# Patient Record
Sex: Female | Born: 1950 | Race: White | Hispanic: No | Smoking: Former smoker
Health system: Southern US, Community
[De-identification: ages and names within clinical notes are randomized; demographics above are authoritative.]

## PROBLEM LIST (undated history)

## (undated) DIAGNOSIS — E785 Hyperlipidemia, unspecified: Secondary | ICD-10-CM

## (undated) DIAGNOSIS — M1990 Unspecified osteoarthritis, unspecified site: Secondary | ICD-10-CM

## (undated) DIAGNOSIS — G4733 Obstructive sleep apnea (adult) (pediatric): Secondary | ICD-10-CM

## (undated) DIAGNOSIS — Z9989 Dependence on other enabling machines and devices: Secondary | ICD-10-CM

## (undated) DIAGNOSIS — I10 Essential (primary) hypertension: Secondary | ICD-10-CM

## (undated) HISTORY — DX: Dependence on other enabling machines and devices: Z99.89

## (undated) HISTORY — DX: Essential (primary) hypertension: I10

## (undated) HISTORY — DX: Unspecified osteoarthritis, unspecified site: M19.90

## (undated) HISTORY — PX: COLONOSCOPY: SHX174

## (undated) HISTORY — DX: Obstructive sleep apnea (adult) (pediatric): G47.33

## (undated) HISTORY — DX: Hyperlipidemia, unspecified: E78.5

## (undated) HISTORY — PX: UPPER GI ENDOSCOPY: SHX6162

---

## 1998-09-07 HISTORY — PX: ABDOMINAL HYSTERECTOMY: SHX81

## 2005-02-09 ENCOUNTER — Ambulatory Visit: Payer: Self-pay

## 2006-05-23 ENCOUNTER — Other Ambulatory Visit: Payer: Self-pay

## 2006-05-23 ENCOUNTER — Inpatient Hospital Stay: Payer: Self-pay | Admitting: Internal Medicine

## 2006-05-24 ENCOUNTER — Other Ambulatory Visit: Payer: Self-pay

## 2014-03-11 ENCOUNTER — Emergency Department: Payer: Self-pay | Admitting: Emergency Medicine

## 2016-12-15 DIAGNOSIS — Z1231 Encounter for screening mammogram for malignant neoplasm of breast: Secondary | ICD-10-CM | POA: Diagnosis not present

## 2017-02-17 DIAGNOSIS — M62838 Other muscle spasm: Secondary | ICD-10-CM | POA: Diagnosis not present

## 2017-02-17 DIAGNOSIS — M549 Dorsalgia, unspecified: Secondary | ICD-10-CM | POA: Diagnosis not present

## 2017-05-04 DIAGNOSIS — K219 Gastro-esophageal reflux disease without esophagitis: Secondary | ICD-10-CM | POA: Diagnosis not present

## 2017-05-04 DIAGNOSIS — E8881 Metabolic syndrome: Secondary | ICD-10-CM | POA: Diagnosis not present

## 2017-05-04 DIAGNOSIS — I119 Hypertensive heart disease without heart failure: Secondary | ICD-10-CM | POA: Diagnosis not present

## 2017-05-04 DIAGNOSIS — Z13220 Encounter for screening for lipoid disorders: Secondary | ICD-10-CM | POA: Diagnosis not present

## 2017-05-04 DIAGNOSIS — R6889 Other general symptoms and signs: Secondary | ICD-10-CM | POA: Diagnosis not present

## 2017-05-04 DIAGNOSIS — R946 Abnormal results of thyroid function studies: Secondary | ICD-10-CM | POA: Diagnosis not present

## 2017-05-04 DIAGNOSIS — E782 Mixed hyperlipidemia: Secondary | ICD-10-CM | POA: Diagnosis not present

## 2017-05-19 DIAGNOSIS — M170 Bilateral primary osteoarthritis of knee: Secondary | ICD-10-CM | POA: Diagnosis not present

## 2017-06-07 ENCOUNTER — Ambulatory Visit (INDEPENDENT_AMBULATORY_CARE_PROVIDER_SITE_OTHER): Payer: Medicare Other | Admitting: General Surgery

## 2017-06-07 ENCOUNTER — Encounter: Payer: Self-pay | Admitting: General Surgery

## 2017-06-07 VITALS — BP 130/70 | HR 72 | Ht 59.0 in | Wt 170.0 lb

## 2017-06-07 DIAGNOSIS — Z800 Family history of malignant neoplasm of digestive organs: Secondary | ICD-10-CM | POA: Diagnosis not present

## 2017-06-07 DIAGNOSIS — Z1211 Encounter for screening for malignant neoplasm of colon: Secondary | ICD-10-CM | POA: Diagnosis not present

## 2017-06-07 MED ORDER — POLYETHYLENE GLYCOL 3350 17 GM/SCOOP PO POWD: ORAL | 0 refills | Status: DC

## 2017-06-07 NOTE — Progress Notes (Signed)
Patient ID: Christina Meadows, female   DOB: Aug 31, 1951, 66 y.o.   MRN: [id number]  Chief Complaint  Patient presents with  . Colonoscopy    HPI Christina Meadows is a 66 y.o. female here for a consult for screening colonoscopy. She does have a history of anal fissure. She reports no other GI issues.  The patient was accompanying her ex-husband for his postoperative visit and mentioned that she had return to the area and since her prior colonoscopy her brother had been diagnosed with colon cancer.  HPI  Past Medical History:  Diagnosis Date  . Arthritis   . Hyperlipidemia   . Hypertension     Past Surgical History:  Procedure Laterality Date  . ABDOMINAL HYSTERECTOMY  2000  . COLONOSCOPY    . UPPER GI ENDOSCOPY      Family History  Problem Relation Age of Onset  . Lung cancer Mother 1  . Diabetes Father   . Colon cancer Brother 33  . Throat cancer Maternal Grandmother     Social History Social History  Substance Use Topics  . Smoking status: Former Smoker    Types: Cigarettes    Quit date: 09/07/1978  . Smokeless tobacco: Never Used  . Alcohol use Yes    Allergies no known allergies  Current Outpatient Prescriptions  Medication Sig Dispense Refill  . acetaminophen (TYLENOL) 500 MG tablet Take 1,000 mg by mouth daily.    . citalopram (CELEXA) 20 MG tablet Take 20 mg by mouth daily.    Marland Kitchen lisinopril-hydrochlorothiazide (PRINZIDE,ZESTORETIC) 20-25 MG tablet Take 1 tablet by mouth daily.    . meloxicam (MOBIC) 15 MG tablet Take 15 mg by mouth daily.    . verapamil (CALAN) 120 MG tablet Take 120 mg by mouth 2 (two) times daily.    . polyethylene glycol powder (GLYCOLAX/MIRALAX) powder 255 grams one bottle for colonoscopy prep 255 g 0   No current facility-administered medications for this visit.     Review of Systems Review of Systems  Constitutional: Negative.   Respiratory: Negative.   Cardiovascular: Negative.     Blood pressure 130/70, pulse 72, height 4\' 11"   (1.499 m), weight 170 lb (77.1 kg).  Physical Exam Physical Exam  Constitutional: She is oriented to person, place, and time. She appears well-developed and well-nourished.  Eyes: Conjunctivae are normal. No scleral icterus.  Neck: Neck supple.  Cardiovascular: Normal rate, regular rhythm and normal heart sounds.   Pulmonary/Chest: Effort normal and breath sounds normal.  Lymphadenopathy:    She has no cervical adenopathy.  Neurological: She is alert and oriented to person, place, and time.  Skin: Skin is warm and dry.  Psychiatric: She has a normal mood and affect.    Data Reviewed Record of previous colonoscopy not available. Prior to 2004.  Assessment    New family history of colon cancer, candidate for screening exam.    Plan    Colonoscopy with possible biopsy/polypectomy prn: Information regarding the procedure, including its potential risks and complications (including but not limited to perforation of the bowel, which may require emergency surgery to repair, and bleeding) was verbally given to the patient. Educational information regarding lower intestinal endoscopy was given to the patient. Written instructions for how to complete the bowel prep using Miralax were provided. The importance of drinking ample fluids to avoid dehydration as a result of the prep emphasized.    HPI, Physical Exam, Assessment and Plan have been scribed under the direction and in the presence of  Christina Bellow, MD  Christina Living, LPN  I have completed the exam and reviewed the above documentation for accuracy and completeness.  I agree with the above.  Haematologist has been used and any errors in dictation or transcription are unintentional.  Christina Ard, M.D., F.A.C.S.  Christina Meadows 06/07/2017, 9:14 PM  Patient has been scheduled for a colonoscopy on 07-06-17 at [facility]. Miralax prescription has been sent in to the patient's pharmacy today. Colonoscopy instructions have  been reviewed with the patient. This patient is aware to call the office if they have further questions.   Christina Meadows, CMA

## 2017-06-07 NOTE — Patient Instructions (Signed)
Colonoscopy, Adult  A colonoscopy is an exam to look at the large intestine. It is done to check for problems, such as:  · Lumps (tumors).  · Growths (polyps).  · Swelling (inflammation).  · Bleeding.    What happens before the procedure?  Eating and drinking   Follow instructions from your doctor about eating and drinking. These instructions may include:  · A few days before the procedure - follow a low-fiber diet.  ? Avoid nuts.  ? Avoid seeds.  ? Avoid dried fruit.  ? Avoid raw fruits.  ? Avoid vegetables.  · 1-3 days before the procedure - follow a clear liquid diet. Avoid liquids that have red or purple dye. Drink only clear liquids, such as:  ? Clear broth or bouillon.  ? Black coffee or tea.  ? Clear juice.  ? Clear soft drinks or sports drinks.  ? Gelatin dessert.  ? Popsicles.  · On the day of the procedure - do not eat or drink anything during the 2 hours before the procedure.    Bowel prep   If you were prescribed an oral bowel prep:  · Take it as told by your doctor. Starting the day before your procedure, you will need to drink a lot of liquid. The liquid will cause you to poop (have bowel movements) until your poop is almost clear or light green.  · If your skin or butt gets irritated from diarrhea, you may:  ? Wipe the area with wipes that have medicine in them, such as adult wet wipes with aloe and vitamin E.  ? Put something on your skin that soothes the area, such as petroleum jelly.  · If you throw up (vomit) while drinking the bowel prep, take a break for up to 60 minutes. Then begin the bowel prep again. If you keep throwing up and you cannot take the bowel prep without throwing up, call your doctor.    General instructions   · Ask your doctor about changing or stopping your normal medicines. This is important if you take diabetes medicines or blood thinners.  · Plan to have someone take you home from the hospital or clinic.  What happens during the procedure?  · An IV tube may be put into one  of your veins.  · You will be given medicine to help you relax (sedative).  · To reduce your risk of infection:  ? Your doctors will wash their hands.  ? Your anal area will be washed with soap.  · You will be asked to lie on your side with your knees bent.  · Your doctor will get a long, thin, flexible tube ready. The tube will have a camera and a light on the end.  · The tube will be put into your anus.  · The tube will be gently put into your large intestine.  · Air will be delivered into your large intestine to keep it open. You may feel some pressure or cramping.  · The camera will be used to take photos.  · A small tissue sample may be removed from your body to be looked at under a microscope (biopsy). If any possible problems are found, the tissue will be sent to a lab for testing.  · If small growths are found, your doctor may remove them and have them checked for cancer.  · The tube that was put into your anus will be slowly removed.  The procedure may vary among doctors   and hospitals.  What happens after the procedure?  · Your doctor will check on you often until the medicines you were given have worn off.  · Do not drive for 24 hours after the procedure.  · You may have a small amount of blood in your poop.  · You may pass gas.  · You may have mild cramps or bloating in your belly (abdomen).  · It is up to you to get the results of your procedure. Ask your doctor, or the department performing the procedure, when your results will be ready.  This information is not intended to replace advice given to you by your health care provider. Make sure you discuss any questions you have with your health care provider.  Document Released: 09/26/2010 Document Revised: 06/24/2016 Document Reviewed: 11/05/2015  Elsevier Interactive Patient Education © 2017 [employer]vier Inc[employer]

## 2017-07-06 ENCOUNTER — Encounter
Admission: RE | Disposition: A | Discharge status: Home / Self Care | Payer: Self-pay | Source: Ambulatory Visit | Attending: General Surgery

## 2017-07-06 ENCOUNTER — Ambulatory Visit: Payer: Medicare Other | Admitting: Anesthesiology

## 2017-07-06 ENCOUNTER — Ambulatory Visit
Admission: RE | Admit: 2017-07-06 | Discharge: 2017-07-06 | Disposition: A | Discharge status: Home / Self Care | Payer: Medicare Other | Source: Ambulatory Visit | Attending: General Surgery | Admitting: General Surgery

## 2017-07-06 ENCOUNTER — Encounter: Payer: Self-pay | Admitting: *Deleted

## 2017-07-06 DIAGNOSIS — Z79899 Other long term (current) drug therapy: Secondary | ICD-10-CM | POA: Diagnosis not present

## 2017-07-06 DIAGNOSIS — Z90710 Acquired absence of both cervix and uterus: Secondary | ICD-10-CM | POA: Diagnosis not present

## 2017-07-06 DIAGNOSIS — M1990 Unspecified osteoarthritis, unspecified site: Secondary | ICD-10-CM | POA: Insufficient documentation

## 2017-07-06 DIAGNOSIS — Z6833 Body mass index (BMI) 33.0-33.9, adult: Secondary | ICD-10-CM | POA: Insufficient documentation

## 2017-07-06 DIAGNOSIS — I10 Essential (primary) hypertension: Secondary | ICD-10-CM | POA: Insufficient documentation

## 2017-07-06 DIAGNOSIS — E785 Hyperlipidemia, unspecified: Secondary | ICD-10-CM | POA: Insufficient documentation

## 2017-07-06 DIAGNOSIS — Z87891 Personal history of nicotine dependence: Secondary | ICD-10-CM | POA: Diagnosis not present

## 2017-07-06 DIAGNOSIS — Z800 Family history of malignant neoplasm of digestive organs: Secondary | ICD-10-CM | POA: Diagnosis not present

## 2017-07-06 DIAGNOSIS — Z1211 Encounter for screening for malignant neoplasm of colon: Secondary | ICD-10-CM | POA: Diagnosis not present

## 2017-07-06 HISTORY — PX: COLONOSCOPY WITH PROPOFOL: SHX5780

## 2017-07-06 SURGERY — COLONOSCOPY WITH PROPOFOL
Anesthesia: General

## 2017-07-06 MED ORDER — SODIUM CHLORIDE 0.9 % IV SOLN
INTRAVENOUS | Status: DC
  Administered 2017-07-06: 14:00:00 via INTRAVENOUS

## 2017-07-06 MED ORDER — PROPOFOL 500 MG/50ML IV EMUL
INTRAVENOUS | Status: DC | PRN
  Administered 2017-07-06: 150 ug/kg/min via INTRAVENOUS

## 2017-07-06 MED ORDER — PROPOFOL 10 MG/ML IV BOLUS
INTRAVENOUS | Status: DC | PRN
  Administered 2017-07-06: 50 mg via INTRAVENOUS

## 2017-07-06 MED ORDER — LIDOCAINE HCL (CARDIAC) 20 MG/ML IV SOLN
INTRAVENOUS | Status: DC | PRN
  Administered 2017-07-06: 60 mg via INTRAVENOUS

## 2017-07-06 NOTE — Transfer of Care (Signed)
Immediate Anesthesia Transfer of Care Note  Patient: Christina Meadows  Procedure(s) Performed: COLONOSCOPY WITH PROPOFOL (N/A )  Patient Location: Endoscopy Unit  Anesthesia Type:General  Level of Consciousness: drowsy and patient cooperative  Airway & Oxygen Therapy: Patient Spontanous Breathing and Patient connected to nasal cannula oxygen  Post-op Assessment: Report given to RN and Post -op Vital signs reviewed and stable  Post vital signs: Reviewed and stable  Last Vitals:  Vitals:   07/06/17 1403  BP: (!) 143/59  Pulse: (!) 58  Resp: 18  Temp: 37.5 C  SpO2: 100%    Last Pain:  Vitals:   07/06/17 1403  TempSrc: Tympanic         Complications: No apparent anesthesia complications

## 2017-07-06 NOTE — H&P (Signed)
No interval change in history or physical exam. Tolerated prep well. For screening colonoscopy.

## 2017-07-06 NOTE — Op Note (Signed)
[facility] Patient Name: Christina Meadows Procedure Date: 07/06/2017 2:27 PM MRN: [id number] Account #: [id number] Date of Birth: [birth year] Admit Type: Outpatient Age: 66 Room: [facility] ENDO ROOM 1 Gender: Female Note Status: Finalized Procedure:            Colonoscopy Indications:          Screening for colorectal malignant neoplasm Providers:            Robert Bellow, MD Referring MD:         Mikeal Hawthorne. Brynda Greathouse MD, MD (Referring MD) Medicines:            Monitored Anesthesia Care Complications:        No immediate complications. Procedure:            Pre-Anesthesia Assessment:                       - Prior to the procedure, a History and Physical was                        performed, and patient medications, allergies and                        sensitivities were reviewed. The patient's tolerance of                        previous anesthesia was reviewed.                       - The risks and benefits of the procedure and the                        sedation options and risks were discussed with the                        patient. All questions were answered and informed                        consent was obtained.                       After obtaining informed consent, the colonoscope was                        passed under direct vision. Throughout the procedure,                        the patient's blood pressure, pulse, and oxygen                        saturations were monitored continuously. The                        Colonoscope was introduced through the anus and                        advanced to the the cecum, identified by appendiceal                        orifice and ileocecal valve. The colonoscopy was  performed without difficulty. The patient tolerated the                        procedure well. The quality of the bowel preparation                        was excellent. Findings:      The entire examined colon  appeared normal on direct and retroflexion       views. Impression:           - The entire examined colon is normal on direct and                        retroflexion views.                       - No specimens collected. Recommendation:       - Repeat colonoscopy in 10 years for screening purposes. Procedure Code(s):    --- Professional ---                       [phone], Colonoscopy, flexible; diagnostic, including                        collection of specimen(s) by brushing or washing, when                        performed (separate procedure) Diagnosis Code(s):    --- Professional ---                       Z12.11, Encounter for screening for malignant neoplasm                        of colon CPT copyright 2016 American Medical Association. All rights reserved. The codes documented in this report are preliminary and upon coder review may  be revised to meet current compliance requirements. Robert Bellow, MD 07/06/2017 2:46:19 PM This report has been signed electronically. Number of Addenda: 0 Note Initiated On: 07/06/2017 2:27 PM Scope Withdrawal Time: 0 hours 6 minutes 1 second  Total Procedure Duration: 0 hours 10 minutes 20 seconds       [facility]

## 2017-07-06 NOTE — Anesthesia Post-op Follow-up Note (Signed)
Anesthesia QCDR form completed.        

## 2017-07-06 NOTE — Anesthesia Preprocedure Evaluation (Signed)
Anesthesia Evaluation  Patient identified by MRN, date of birth, ID band Patient awake    Reviewed: Allergy & Precautions, NPO status , Patient's Chart, lab work & pertinent test results  Airway Mallampati: II       Dental  (+) Teeth Intact   Pulmonary neg pulmonary ROS, former smoker,    breath sounds clear to auscultation       Cardiovascular Exercise Tolerance: Good hypertension, Pt. on medications  Rhythm:Regular     Neuro/Psych    GI/Hepatic negative GI ROS, Neg liver ROS,   Endo/Other  Morbid obesity  Renal/GU negative Renal ROS  negative genitourinary   Musculoskeletal   Abdominal (+) + obese,   Peds negative pediatric ROS (+)  Hematology negative hematology ROS (+)   Anesthesia Other Findings   Reproductive/Obstetrics                             Anesthesia Physical Anesthesia Plan  ASA: II  Anesthesia Plan: General   Post-op Pain Management:    Induction:   PONV Risk Score and Plan: 0  Airway Management Planned: Natural Airway and Nasal Cannula  Additional Equipment:   Intra-op Plan:   Post-operative Plan:   Informed Consent: I have reviewed the patients History and Physical, chart, labs and discussed the procedure including the risks, benefits and alternatives for the proposed anesthesia with the patient or authorized representative who has indicated his/her understanding and acceptance.     Plan Discussed with: CRNA  Anesthesia Plan Comments:         Anesthesia Quick Evaluation

## 2017-07-07 ENCOUNTER — Encounter: Payer: Self-pay | Admitting: General Surgery

## 2017-07-07 NOTE — Anesthesia Postprocedure Evaluation (Signed)
Anesthesia Post Note  Patient: Christina Meadows  Procedure(s) Performed: COLONOSCOPY WITH PROPOFOL (N/A )  Patient location during evaluation: PACU Anesthesia Type: General Level of consciousness: awake Pain management: pain level controlled Vital Signs Assessment: post-procedure vital signs reviewed and stable Cardiovascular status: stable Anesthetic complications: no     Last Vitals:  Vitals:   07/06/17 1510 07/06/17 1520  BP: (!) 143/62 (!) 148/56  Pulse: 67 74  Resp: 17   Temp:    SpO2: 100%     Last Pain:  Vitals:   07/06/17 1450  TempSrc: Tympanic                 VAN STAVEREN,Keijuan Schellhase

## 2017-07-16 DIAGNOSIS — M25561 Pain in right knee: Secondary | ICD-10-CM | POA: Diagnosis not present

## 2017-07-16 DIAGNOSIS — M1712 Unilateral primary osteoarthritis, left knee: Secondary | ICD-10-CM | POA: Diagnosis not present

## 2017-07-16 DIAGNOSIS — M25562 Pain in left knee: Secondary | ICD-10-CM | POA: Diagnosis not present

## 2017-07-16 DIAGNOSIS — M170 Bilateral primary osteoarthritis of knee: Secondary | ICD-10-CM | POA: Diagnosis not present

## 2017-08-20 DIAGNOSIS — M25562 Pain in left knee: Secondary | ICD-10-CM | POA: Diagnosis not present

## 2017-09-03 DIAGNOSIS — R001 Bradycardia, unspecified: Secondary | ICD-10-CM | POA: Diagnosis not present

## 2017-09-05 DIAGNOSIS — R001 Bradycardia, unspecified: Secondary | ICD-10-CM | POA: Diagnosis not present

## 2017-09-07 DIAGNOSIS — G4733 Obstructive sleep apnea (adult) (pediatric): Secondary | ICD-10-CM

## 2017-09-07 HISTORY — DX: Obstructive sleep apnea (adult) (pediatric): G47.33

## 2017-09-14 DIAGNOSIS — J039 Acute tonsillitis, unspecified: Secondary | ICD-10-CM | POA: Diagnosis not present

## 2017-10-06 DIAGNOSIS — M170 Bilateral primary osteoarthritis of knee: Secondary | ICD-10-CM | POA: Diagnosis not present

## 2017-10-06 DIAGNOSIS — M1711 Unilateral primary osteoarthritis, right knee: Secondary | ICD-10-CM | POA: Diagnosis not present

## 2017-10-13 DIAGNOSIS — I10 Essential (primary) hypertension: Secondary | ICD-10-CM | POA: Diagnosis not present

## 2017-10-19 DIAGNOSIS — Z7689 Persons encountering health services in other specified circumstances: Secondary | ICD-10-CM | POA: Diagnosis not present

## 2017-10-19 DIAGNOSIS — R002 Palpitations: Secondary | ICD-10-CM | POA: Diagnosis not present

## 2017-10-19 DIAGNOSIS — G4730 Sleep apnea, unspecified: Secondary | ICD-10-CM | POA: Diagnosis not present

## 2017-10-19 DIAGNOSIS — R0602 Shortness of breath: Secondary | ICD-10-CM | POA: Diagnosis not present

## 2017-10-19 DIAGNOSIS — R0683 Snoring: Secondary | ICD-10-CM | POA: Diagnosis not present

## 2017-10-19 DIAGNOSIS — I10 Essential (primary) hypertension: Secondary | ICD-10-CM | POA: Diagnosis not present

## 2017-10-19 DIAGNOSIS — R9431 Abnormal electrocardiogram [ECG] [EKG]: Secondary | ICD-10-CM | POA: Diagnosis not present

## 2017-10-25 DIAGNOSIS — R9431 Abnormal electrocardiogram [ECG] [EKG]: Secondary | ICD-10-CM | POA: Diagnosis not present

## 2017-11-01 DIAGNOSIS — J320 Chronic maxillary sinusitis: Secondary | ICD-10-CM | POA: Diagnosis not present

## 2017-11-01 DIAGNOSIS — J301 Allergic rhinitis due to pollen: Secondary | ICD-10-CM | POA: Diagnosis not present

## 2017-11-01 DIAGNOSIS — R42 Dizziness and giddiness: Secondary | ICD-10-CM | POA: Diagnosis not present

## 2017-11-01 DIAGNOSIS — H903 Sensorineural hearing loss, bilateral: Secondary | ICD-10-CM | POA: Diagnosis not present

## 2017-11-05 HISTORY — PX: KNEE SURGERY: SHX244

## 2017-11-10 ENCOUNTER — Ambulatory Visit (INDEPENDENT_AMBULATORY_CARE_PROVIDER_SITE_OTHER): Payer: Medicare Other | Admitting: Family Medicine

## 2017-11-10 ENCOUNTER — Encounter: Payer: Self-pay | Admitting: Family Medicine

## 2017-11-10 VITALS — BP 130/72 | HR 67 | Temp 98.2°F | Ht 59.25 in | Wt 176.2 lb

## 2017-11-10 DIAGNOSIS — Z23 Encounter for immunization: Secondary | ICD-10-CM

## 2017-11-10 DIAGNOSIS — Z7689 Persons encountering health services in other specified circumstances: Secondary | ICD-10-CM | POA: Diagnosis not present

## 2017-11-10 DIAGNOSIS — I10 Essential (primary) hypertension: Secondary | ICD-10-CM | POA: Diagnosis not present

## 2017-11-10 DIAGNOSIS — F339 Major depressive disorder, recurrent, unspecified: Secondary | ICD-10-CM | POA: Diagnosis not present

## 2017-11-10 DIAGNOSIS — R002 Palpitations: Secondary | ICD-10-CM | POA: Insufficient documentation

## 2017-11-10 MED ORDER — LISINOPRIL-HYDROCHLOROTHIAZIDE 20-25 MG PO TABS: 1.0000 | ORAL_TABLET | Freq: Every day | ORAL | 1 refills | Status: DC

## 2017-11-10 MED ORDER — CITALOPRAM HYDROBROMIDE 20 MG PO TABS: 20.0000 mg | ORAL_TABLET | Freq: Every day | ORAL | 1 refills | Status: DC

## 2017-11-10 NOTE — Patient Instructions (Addendum)
Please call and schedule an appointment for screening mammogram. A referral is not needed.  [place]   Please schedule an eye exam  Can try plain mucinex to loosen phlegm.   Please schedule your welcome to medicare visit for later this month.

## 2017-11-10 NOTE — Progress Notes (Signed)
Subjective:    Patient ID: Christina Meadows, female    DOB: 30-Apr-1951, 66 y.o.   MRN: [id number]  HPI This is a 67 yo female who presents today to establish care. Was previously seeing Dr. Brynda Greathouse who retired then saw the MD who took over his practice. Has had difficulty with access and communication so desires to establish here. She is divorced, lives with her daughter and her ex-husband. Has 4 grandchildren. She had a son who died at age 35. Enjoys yard work and is active with her church.   She had acute tonsillitis two months ago. Had two rounds of antibiotics. Was scheduled to have a knee replacement but had to have it rescheduled. She went to ENT. Had scan at ENT, was told she had severe sinus infection and was given additional antibiotic and prednisone and sinulair. Feels better. No cough, but feels like she has some sputum that won't come up. No nasal drainage. No fever.   Has osteoarthritis in both knees, is awaiting ENT clearance to have surgery, doing one at a time. Has been very limited in ability to walk due to pain.   Hypertension- currently off verapamil, couldn't get refill. Taking lisinopril/hctz. No side effects.   Depression after finding her brother dead of an MI and after her divorce. Husband left her for another woman who later died and now her ex husband lives with her and her daughter.   Went on [facility] 4/18.   Last CPE- she thinks it is the last five years Mammo- 2/18 Pap- hysterectomy Colonoscopy- 07/06/2017 Tdap- unknown Flu- declines Eye- over due Dental- dentures Exercise- not regular, somewhat limited by knee pain  Past Medical History:  Diagnosis Date  . Arthritis   . Hyperlipidemia   . Hypertension    Past Surgical History:  Procedure Laterality Date  . ABDOMINAL HYSTERECTOMY  2000  . COLONOSCOPY    . COLONOSCOPY WITH PROPOFOL N/A 07/06/2017   Procedure: COLONOSCOPY WITH PROPOFOL;  Surgeon: Robert Bellow, MD;  Location: [facility] ENDOSCOPY;   Service: Endoscopy;  Laterality: N/A;  . UPPER GI ENDOSCOPY     Family History  Problem Relation Age of Onset  . Lung cancer Mother 47  . Diabetes Father   . COPD Father   . Arthritis Father   . Colon cancer Brother 64  . Arthritis Brother   . Diabetes Brother   . Early death Brother   . Hearing loss Brother   . Heart disease Brother   . Hyperlipidemia Brother   . Hypertension Brother   . Throat cancer Maternal Grandmother    Social History   Tobacco Use  . Smoking status: Former Smoker    Types: Cigarettes    Last attempt to quit: 09/07/1978    Years since quitting: 39.2  . Smokeless tobacco: Never Used  Substance Use Topics  . Alcohol use: Yes  . Drug use: No      Review of Systems  Constitutional: Positive for fatigue. Negative for fever and unexpected weight change.  HENT: Positive for congestion and sinus pressure.   Eyes: Negative.   Respiratory: Positive for cough (dry).   Cardiovascular: Positive for palpitations (rare). Negative for chest pain and leg swelling.  Gastrointestinal: Positive for diarrhea (loose stools).  Genitourinary: Negative for dysuria and frequency.  Musculoskeletal: Positive for arthralgias (bilateral knees).  Neurological: Positive for dizziness (occasional with recent sinus problems) and headaches (occasional).       Objective:   Physical Exam  Constitutional: She is  oriented to person, place, and time. She appears well-developed and well-nourished. No distress.  Obese.   HENT:  Head: Normocephalic and atraumatic.  Right Ear: External ear normal.  Left Ear: External ear normal.  Eyes: Conjunctivae are normal.  Neck: Normal range of motion. Neck supple.  Cardiovascular: Normal rate.  Pulmonary/Chest: Effort normal. No respiratory distress.  Neurological: She is alert and oriented to person, place, and time.  Skin: Skin is warm and dry. She is not diaphoretic.  Psychiatric: She has a normal mood and affect. Her behavior is  normal. Judgment and thought content normal.  Vitals reviewed.     BP 130/72   Pulse 67   Temp 98.2 F (36.8 C) (Oral)   Ht 4' 11.25" (1.505 m)   Wt 176 lb 4 oz (79.9 kg)   SpO2 99%   BMI 35.30 kg/m  Wt Readings from Last 3 Encounters:  11/10/17 176 lb 4 oz (79.9 kg)  07/06/17 167 lb (75.8 kg)  06/07/17 170 lb (77.1 kg)   Depression screen [facility] 2/9 11/10/2017 11/10/2017  Decreased Interest 0 0  Down, Depressed, Hopeless 0 0  PHQ - 2 Score 0 0       Assessment & Plan:  1. Encounter to establish care - Discussed and encouraged healthy lifestyle choices- adequate sleep, regular exercise, stress management and healthy food choices.  - follow up with Welcome to Medicare Visit - Will request records from previous PCP and ENT - provided phone number to schedule mammogram - encouraged her to schedule eye exam - labs from last 6 months reviewed in Care Everywhere- no labs needed today  2. Need for vaccination with 13-polyvalent pneumococcal conjugate vaccine - Pneumococcal conjugate vaccine 13-valent  3. Essential hypertension - lisinopril-hydrochlorothiazide (PRINZIDE,ZESTORETIC) 20-25 MG tablet; Take 1 tablet by mouth daily.  Dispense: 90 tablet; Refill: 1 - blood pressure well controlled off verapamil, will continue on lisinopril-HCTZ  4. Depression, recurrent ([place]) - patient reports well controlled on citalopram - citalopram (CELEXA) 20 MG tablet; Take 1 tablet (20 mg total) by mouth daily.  Dispense: 90 tablet; Refill: [place], FNP-BC  [facility]  11/10/2017 8:22 PM

## 2017-11-15 DIAGNOSIS — J0100 Acute maxillary sinusitis, unspecified: Secondary | ICD-10-CM | POA: Diagnosis not present

## 2017-11-18 DIAGNOSIS — Z1231 Encounter for screening mammogram for malignant neoplasm of breast: Secondary | ICD-10-CM | POA: Diagnosis not present

## 2017-11-18 LAB — HM MAMMOGRAPHY

## 2017-11-19 DIAGNOSIS — M170 Bilateral primary osteoarthritis of knee: Secondary | ICD-10-CM | POA: Diagnosis not present

## 2017-11-19 DIAGNOSIS — F419 Anxiety disorder, unspecified: Secondary | ICD-10-CM | POA: Insufficient documentation

## 2017-11-19 DIAGNOSIS — I10 Essential (primary) hypertension: Secondary | ICD-10-CM | POA: Insufficient documentation

## 2017-11-19 DIAGNOSIS — R799 Abnormal finding of blood chemistry, unspecified: Secondary | ICD-10-CM | POA: Diagnosis not present

## 2017-12-01 ENCOUNTER — Ambulatory Visit (INDEPENDENT_AMBULATORY_CARE_PROVIDER_SITE_OTHER): Payer: Medicare Other | Admitting: Family Medicine

## 2017-12-01 ENCOUNTER — Encounter: Payer: Self-pay | Admitting: Family Medicine

## 2017-12-01 VITALS — BP 124/70 | HR 77 | Temp 98.1°F | Ht 59.0 in | Wt 174.5 lb

## 2017-12-01 DIAGNOSIS — Z0000 Encounter for general adult medical examination without abnormal findings: Secondary | ICD-10-CM

## 2017-12-01 DIAGNOSIS — M170 Bilateral primary osteoarthritis of knee: Secondary | ICD-10-CM

## 2017-12-01 DIAGNOSIS — I10 Essential (primary) hypertension: Secondary | ICD-10-CM

## 2017-12-01 NOTE — Patient Instructions (Signed)
  Christina Meadows , Thank you for taking time to come for your Medicare Wellness Visit. I appreciate your ongoing commitment to your health goals. Please review the following plan we discussed and let me know if I can assist you in the future.   These are the goals we discussed: Goals    Recover from knee replacement      This is a list of the screening recommended for you and due dates:  Health Maintenance  Topic Date Due  .  Hepatitis C: One time screening is recommended by Center for Disease Control  (CDC) for  adults born from [birth year] through 1964.   [birth year]  . HIV Screening  12/08/1966  . Tetanus Vaccine  12/08/1970  . Pap Smear  12/07/1972  . Mammogram  12/07/2001  . DEXA scan (bone density measurement)  12/07/2016  . Flu Shot  12/06/2018*  . Pneumonia vaccines (2 of 2 - PPSV23) 11/11/2018  . Colon Cancer Screening  07/07/2027  *Topic was postponed. The date shown is not the original due date.   Follow in 6 months

## 2017-12-01 NOTE — Progress Notes (Signed)
Subjective:    Christina Meadows is a 67 y.o. female who presents for Medicare Initial preventive examination.  Preventive Screening-Counseling & Management  Tobacco Social History   Tobacco Use  Smoking Status Former Smoker  . Types: Cigarettes  . Last attempt to quit: 09/07/1978  . Years since quitting: 39.2  Smokeless Tobacco Never Used     Problems Prior to Visit 1.   Current Problems (verified) Patient Active Problem List   Diagnosis Date Noted  . Palpitations 11/10/2017  . Sleep concern 11/10/2017  . Bilateral primary osteoarthritis of knee 07/16/2017  . Encounter for screening colonoscopy 06/07/2017  . Family history of colon cancer 06/07/2017    Medications Prior to Visit Current Outpatient Medications on File Prior to Visit  Medication Sig Dispense Refill  . acetaminophen (TYLENOL) 500 MG tablet Take 1,000 mg by mouth daily.    . citalopram (CELEXA) 20 MG tablet Take 1 tablet (20 mg total) by mouth daily. 90 tablet 1  . fluticasone (FLONASE) 50 MCG/ACT nasal spray     . lisinopril-hydrochlorothiazide (PRINZIDE,ZESTORETIC) 20-25 MG tablet Take 1 tablet by mouth daily. 90 tablet 1  . meloxicam (MOBIC) 15 MG tablet Take 15 mg by mouth daily.    . montelukast (SINGULAIR) 10 MG tablet      No current facility-administered medications on file prior to visit.     Current Medications (verified) Current Outpatient Medications  Medication Sig Dispense Refill  . acetaminophen (TYLENOL) 500 MG tablet Take 1,000 mg by mouth daily.    . citalopram (CELEXA) 20 MG tablet Take 1 tablet (20 mg total) by mouth daily. 90 tablet 1  . fluticasone (FLONASE) 50 MCG/ACT nasal spray     . lisinopril-hydrochlorothiazide (PRINZIDE,ZESTORETIC) 20-25 MG tablet Take 1 tablet by mouth daily. 90 tablet 1  . meloxicam (MOBIC) 15 MG tablet Take 15 mg by mouth daily.    . montelukast (SINGULAIR) 10 MG tablet      No current facility-administered medications for this visit.      Allergies  (verified) Patient has no known allergies.   PAST HISTORY  Family History Family History  Problem Relation Age of Onset  . Lung cancer Mother 15  . Diabetes Father   . COPD Father   . Arthritis Father   . Colon cancer Brother 33  . Arthritis Brother   . Diabetes Brother   . Early death Brother   . Hearing loss Brother   . Heart disease Brother   . Hyperlipidemia Brother   . Hypertension Brother   . Throat cancer Maternal Grandmother     Social History Social History   Tobacco Use  . Smoking status: Former Smoker    Types: Cigarettes    Last attempt to quit: 09/07/1978    Years since quitting: 39.2  . Smokeless tobacco: Never Used  Substance Use Topics  . Alcohol use: Yes     Are there smokers in your home (other than you)? No  Risk Factors Current exercise habits: Exercise is limited by orthopedic condition(s): Bilateral knee pain..  Dietary issues discussed: increased vegetables    Cardiac risk factors: advanced age (older than 60 for men, 45 for women).  Depression Screen (Note: if answer to either of the following is "Yes", a more complete depression screening is indicated)   Over the past 2 weeks, have you felt down, depressed or hopeless? No  Over the past 2 weeks, have you felt little interest or pleasure in doing things? No  Have you lost  interest or pleasure in daily life? No  Do you often feel hopeless? No  Do you cry easily over simple problems? No  Activities of Daily Living In your present state of health, do you have any difficulty performing the following activities?:  Driving? No Managing money?  No Feeding yourself? No Getting from bed to chair? No   BP 124/70   Pulse 77   Temp 98.1 F (36.7 C) (Oral)   Ht 4\' 11"  (1.499 m)   Wt 174 lb 8 oz (79.2 kg)   SpO2 94%   BMI 35.24 kg/m   General Appearance:    Alert, cooperative, no distress, appears stated age  Head:    Normocephalic, without obvious abnormality, atraumatic  Eyes:    PERRL,  conjunctiva/corneas clear, EOM's intact, fundi    benign, both eyes  Ears:    Normal TM's and external ear canals, both ears  Nose:   Nares normal, septum midline, mucosa normal, no drainage    or sinus tenderness  Throat:   Lips, mucosa, and tongue normal; teeth and gums normal  Neck:   Supple, symmetrical, trachea midline, no adenopathy;    thyroid:  no enlargement/tenderness/nodules; no carotid   bruit or JVD  Back:     Symmetric, no curvature, ROM normal, no CVA tenderness  Lungs:     Clear to auscultation bilaterally, respirations unlabored  Chest Wall:    No tenderness or deformity   Heart:    Regular rate and rhythm, S1 and S2 normal, no murmur, rub   or gallop  Breast Exam:    No tenderness, masses, or nipple abnormality  Abdomen:     Soft, non-tender, bowel sounds active all four quadrants,    no masses, no organomegaly  Genitalia:    Normal female without lesion, discharge or tenderness  Rectal:    Normal tone, normal prostate, no masses or tenderness;   guaiac negative stool  Extremities:   Extremities normal, atraumatic, no cyanosis or edema  Pulses:   2+ and symmetric all extremities  Skin:   Skin color, texture, turgor normal, no rashes or lesions  Lymph nodes:   Cervical, supraclavicular, and axillary nodes normal  Neurologic:   CNII-XII intact, normal strength, sensation and reflexes    throughout   Climbing a flight of stairs? Yes Preparing food and eating?: No Bathing or showering? No Getting dressed: No Getting to the toilet? No Using the toilet:No Moving around from place to place: No In the past year have you fallen or had a near fall?:Yes   Are you sexually active?  No  Do you have more than one partner?  No  Hearing Difficulties: No Do you often ask people to speak up or repeat themselves? No Do you experience ringing or noises in your ears? No Do you have difficulty understanding soft or whispered voices? No   Do you feel that you have a problem with  memory? No  Do you often misplace items? No  Do you feel safe at home?  Yes  Cognitive Testing  Alert? Yes  Normal Appearance?Yes  Oriented to person? Yes  Place? Yes   Time? Yes  Recall of three objects?  Yes  Can perform simple calculations? Yes  Displays appropriate judgment?Yes    Can read the correct time from a watch face?Yes   Advanced Directives have been discussed with the patient? Yes  List the Names of Other Physician/Practitioners you currently use: 1.  [facility]  Indicate any recent Medical  Services you may have received from other than [facility] providers in the past year (date may be approximate).  Immunization History  Administered Date(s) Administered  . Pneumococcal Conjugate-13 11/10/2017    Screening Tests Health Maintenance  Topic Date Due  . Hepatitis C Screening  13-Aug-1951  . HIV Screening  12/08/1966  . TETANUS/TDAP  12/08/1970  . PAP SMEAR  12/07/1972  . MAMMOGRAM  12/07/2001  . DEXA SCAN  12/07/2016  . INFLUENZA VACCINE  12/06/2018 (Originally 04/07/2017)  . PNA vac Low Risk Adult (2 of 2 - PPSV23) 11/11/2018  . COLONOSCOPY  07/07/2027    All answers were reviewed with the patient and necessary referrals were made:  Elby Beck, FNP   12/01/2017   History reviewed: allergies, current medications, past family history, past medical history, past social history, past surgical history and problem list  Review of Systems A comprehensive review of systems was negative.    Objective:     Vision by Snellen chart: right eye:20/20, left eye:20/20  Body mass index is 35.24 kg/m. BP 124/70   Pulse 77   Temp 98.1 F (36.7 C) (Oral)   Ht 4\' 11"  (1.499 m)   Wt 174 lb 8 oz (79.2 kg)   SpO2 94%   BMI 35.24 kg/m   BP 124/70   Pulse 77   Temp 98.1 F (36.7 C) (Oral)   Ht 4\' 11"  (1.499 m)   Wt 174 lb 8 oz (79.2 kg)   SpO2 94%   BMI 35.24 kg/m   General Appearance:    Alert, cooperative, no distress, appears stated age  Head:     Normocephalic, without obvious abnormality, atraumatic  Eyes:    PERRL, conjunctiva/corneas clear, EOM's intact  Ears:    External normal  Nose:   Nares normal, septum midline, mucosa normal, no drainage    or sinus tenderness  Throat:   Lips, mucosa, and tongue normal; teeth and gums normal  Neck:   Supple, symmetrical, trachea midline, no adenopathy;    thyroid:  no enlargement/tenderness/nodules  Back:     Symmetric, no curvature, ROM normal  Lungs:     Clear to auscultation bilaterally, respirations unlabored  Chest Wall:    No tenderness or deformity   Heart:    Regular rate and rhythm, S1 and S2 normal, no murmur, rub   or gallop  Breast Exam:    No tenderness, masses, or nipple abnormality  Abdomen:     Soft, non-tender, bowel sounds active all four quadrants,    no masses, no organomegaly  Genitalia:    Not performed  Rectal:    Not performed  Extremities:   Extremities normal, atraumatic, no cyanosis or edema  Pulses:   2+ and symmetric all extremities  Skin:   Skin color, texture, turgor normal, no rashes or lesions  Lymph nodes:   Cervical, supraclavicular, and axillary nodes normal  Neurologic:   Normal strength, sensation throughout       Assessment:     Welcome to Medicare preventive visit  Essential hypertension  Bilateral primary osteoarthritis of knee       Plan:     During the course of the visit the patient was educated and counseled about appropriate screening and preventive services including:    Screening mammography  Bone densitometry screening  Glaucoma screening   Awaiting records from prior PCP  Diet review for nutrition referral? Yes ____  Not Indicated ___X_   Patient Instructions (the written plan) was given to  the patient.  Medicare Attestation I have personally reviewed: The patient's medical and social history Their use of alcohol, tobacco or illicit drugs Their current medications and supplements The patient's functional  ability including ADLs,fall risks, home safety risks, cognitive, and hearing and visual impairment Diet and physical activities Evidence for depression or mood disorders  The patient's weight, height, BMI, and visual acuity have been recorded in the chart.  I have made referrals, counseling, and provided education to the patient based on review of the above and I have provided the patient with a written personalized care plan for preventive services.     Elby Beck, FNP   12/01/2017

## 2017-12-02 DIAGNOSIS — E669 Obesity, unspecified: Secondary | ICD-10-CM | POA: Diagnosis present

## 2017-12-02 DIAGNOSIS — M25561 Pain in right knee: Secondary | ICD-10-CM | POA: Diagnosis not present

## 2017-12-02 DIAGNOSIS — I959 Hypotension, unspecified: Secondary | ICD-10-CM | POA: Diagnosis not present

## 2017-12-02 DIAGNOSIS — Z90710 Acquired absence of both cervix and uterus: Secondary | ICD-10-CM | POA: Diagnosis not present

## 2017-12-02 DIAGNOSIS — Z471 Aftercare following joint replacement surgery: Secondary | ICD-10-CM | POA: Diagnosis not present

## 2017-12-02 DIAGNOSIS — K219 Gastro-esophageal reflux disease without esophagitis: Secondary | ICD-10-CM | POA: Diagnosis present

## 2017-12-02 DIAGNOSIS — I10 Essential (primary) hypertension: Secondary | ICD-10-CM | POA: Diagnosis not present

## 2017-12-02 DIAGNOSIS — Z87891 Personal history of nicotine dependence: Secondary | ICD-10-CM | POA: Diagnosis not present

## 2017-12-02 DIAGNOSIS — Z6835 Body mass index (BMI) 35.0-35.9, adult: Secondary | ICD-10-CM | POA: Diagnosis not present

## 2017-12-02 DIAGNOSIS — M1711 Unilateral primary osteoarthritis, right knee: Secondary | ICD-10-CM | POA: Diagnosis not present

## 2017-12-02 DIAGNOSIS — F419 Anxiety disorder, unspecified: Secondary | ICD-10-CM | POA: Diagnosis not present

## 2017-12-02 DIAGNOSIS — G8918 Other acute postprocedural pain: Secondary | ICD-10-CM | POA: Diagnosis not present

## 2017-12-02 DIAGNOSIS — Z96651 Presence of right artificial knee joint: Secondary | ICD-10-CM | POA: Diagnosis not present

## 2017-12-03 DIAGNOSIS — Z0000 Encounter for general adult medical examination without abnormal findings: Secondary | ICD-10-CM | POA: Insufficient documentation

## 2017-12-06 DIAGNOSIS — M25562 Pain in left knee: Secondary | ICD-10-CM | POA: Diagnosis not present

## 2017-12-08 DIAGNOSIS — M25562 Pain in left knee: Secondary | ICD-10-CM | POA: Diagnosis not present

## 2017-12-10 DIAGNOSIS — M25562 Pain in left knee: Secondary | ICD-10-CM | POA: Diagnosis not present

## 2017-12-13 DIAGNOSIS — M25562 Pain in left knee: Secondary | ICD-10-CM | POA: Diagnosis not present

## 2017-12-15 DIAGNOSIS — Z96651 Presence of right artificial knee joint: Secondary | ICD-10-CM | POA: Insufficient documentation

## 2017-12-15 DIAGNOSIS — M25562 Pain in left knee: Secondary | ICD-10-CM | POA: Diagnosis not present

## 2017-12-16 ENCOUNTER — Encounter: Payer: Self-pay | Admitting: Family Medicine

## 2017-12-17 DIAGNOSIS — Z96651 Presence of right artificial knee joint: Secondary | ICD-10-CM | POA: Diagnosis not present

## 2017-12-17 DIAGNOSIS — M25562 Pain in left knee: Secondary | ICD-10-CM | POA: Diagnosis not present

## 2017-12-17 DIAGNOSIS — Z471 Aftercare following joint replacement surgery: Secondary | ICD-10-CM | POA: Diagnosis not present

## 2017-12-20 DIAGNOSIS — M25562 Pain in left knee: Secondary | ICD-10-CM | POA: Diagnosis not present

## 2017-12-22 DIAGNOSIS — M25562 Pain in left knee: Secondary | ICD-10-CM | POA: Diagnosis not present

## 2017-12-23 DIAGNOSIS — M25562 Pain in left knee: Secondary | ICD-10-CM | POA: Diagnosis not present

## 2017-12-27 DIAGNOSIS — M25562 Pain in left knee: Secondary | ICD-10-CM | POA: Diagnosis not present

## 2017-12-29 DIAGNOSIS — M25562 Pain in left knee: Secondary | ICD-10-CM | POA: Diagnosis not present

## 2017-12-30 DIAGNOSIS — M25562 Pain in left knee: Secondary | ICD-10-CM | POA: Diagnosis not present

## 2018-01-03 DIAGNOSIS — M25562 Pain in left knee: Secondary | ICD-10-CM | POA: Diagnosis not present

## 2018-01-07 DIAGNOSIS — M25562 Pain in left knee: Secondary | ICD-10-CM | POA: Diagnosis not present

## 2018-01-10 DIAGNOSIS — M25562 Pain in left knee: Secondary | ICD-10-CM | POA: Diagnosis not present

## 2018-01-14 DIAGNOSIS — M25562 Pain in left knee: Secondary | ICD-10-CM | POA: Diagnosis not present

## 2018-01-17 DIAGNOSIS — M1712 Unilateral primary osteoarthritis, left knee: Secondary | ICD-10-CM | POA: Diagnosis not present

## 2018-01-17 DIAGNOSIS — M170 Bilateral primary osteoarthritis of knee: Secondary | ICD-10-CM | POA: Diagnosis not present

## 2018-01-17 DIAGNOSIS — Z01818 Encounter for other preprocedural examination: Secondary | ICD-10-CM | POA: Diagnosis not present

## 2018-01-17 DIAGNOSIS — Z96651 Presence of right artificial knee joint: Secondary | ICD-10-CM | POA: Diagnosis not present

## 2018-02-04 ENCOUNTER — Telehealth: Payer: Self-pay | Admitting: Family Medicine

## 2018-02-04 ENCOUNTER — Other Ambulatory Visit: Payer: Self-pay | Admitting: Family Medicine

## 2018-02-04 MED ORDER — MELOXICAM 15 MG PO TABS: 15.0000 mg | ORAL_TABLET | Freq: Every day | ORAL | 1 refills | Status: DC | PRN

## 2018-02-04 MED ORDER — FLUTICASONE PROPIONATE 50 MCG/ACT NA SUSP: 2.0000 | Freq: Every day | NASAL | 5 refills | Status: DC

## 2018-02-04 MED ORDER — MONTELUKAST SODIUM 10 MG PO TABS: 10.0000 mg | ORAL_TABLET | Freq: Every day | ORAL | 3 refills | Status: DC

## 2018-02-04 NOTE — Telephone Encounter (Signed)
Copied from [place] [phone]. Topic: Quick Communication - Rx Refill/Question >> Feb 04, 2018  1:35 PM Margot Ables wrote: Medication: new pt to Clarene Reamer, NP - pt needs flonase, meloxicam, and singulair refilled - pt out - she has appt scheduled 06/03/18 - pt states that she called in 02/03/18 but no notes recorded Has the patient contacted their pharmacy? No - change of MD Preferred Pharmacy (with phone number or street name): [facility] [address], [place] [phone] (Phone) [phone] (Fax)

## 2018-02-04 NOTE — Telephone Encounter (Signed)
Medication orders sent to her pharmacy.

## 2018-02-04 NOTE — Telephone Encounter (Signed)
Medications have not been filled by you. Okay to refill? And instructions on historical meds.

## 2018-02-05 HISTORY — PX: KNEE SURGERY: SHX244

## 2018-02-10 DIAGNOSIS — M25562 Pain in left knee: Secondary | ICD-10-CM | POA: Diagnosis not present

## 2018-03-03 DIAGNOSIS — Z96652 Presence of left artificial knee joint: Secondary | ICD-10-CM | POA: Diagnosis not present

## 2018-03-03 DIAGNOSIS — G8918 Other acute postprocedural pain: Secondary | ICD-10-CM | POA: Diagnosis not present

## 2018-03-03 DIAGNOSIS — Z96651 Presence of right artificial knee joint: Secondary | ICD-10-CM | POA: Diagnosis present

## 2018-03-03 DIAGNOSIS — K219 Gastro-esophageal reflux disease without esophagitis: Secondary | ICD-10-CM | POA: Diagnosis present

## 2018-03-03 DIAGNOSIS — Z87891 Personal history of nicotine dependence: Secondary | ICD-10-CM | POA: Diagnosis not present

## 2018-03-03 DIAGNOSIS — Z7982 Long term (current) use of aspirin: Secondary | ICD-10-CM | POA: Diagnosis not present

## 2018-03-03 DIAGNOSIS — Z471 Aftercare following joint replacement surgery: Secondary | ICD-10-CM | POA: Diagnosis not present

## 2018-03-03 DIAGNOSIS — F419 Anxiety disorder, unspecified: Secondary | ICD-10-CM | POA: Diagnosis present

## 2018-03-03 DIAGNOSIS — M25562 Pain in left knee: Secondary | ICD-10-CM | POA: Diagnosis not present

## 2018-03-03 DIAGNOSIS — Z90710 Acquired absence of both cervix and uterus: Secondary | ICD-10-CM | POA: Diagnosis not present

## 2018-03-03 DIAGNOSIS — M1712 Unilateral primary osteoarthritis, left knee: Secondary | ICD-10-CM | POA: Diagnosis present

## 2018-03-03 DIAGNOSIS — I10 Essential (primary) hypertension: Secondary | ICD-10-CM | POA: Diagnosis present

## 2018-03-07 DIAGNOSIS — M25562 Pain in left knee: Secondary | ICD-10-CM | POA: Diagnosis not present

## 2018-03-09 DIAGNOSIS — M25562 Pain in left knee: Secondary | ICD-10-CM | POA: Diagnosis not present

## 2018-03-14 DIAGNOSIS — M25562 Pain in left knee: Secondary | ICD-10-CM | POA: Diagnosis not present

## 2018-03-16 DIAGNOSIS — Z471 Aftercare following joint replacement surgery: Secondary | ICD-10-CM | POA: Diagnosis not present

## 2018-03-16 DIAGNOSIS — M25461 Effusion, right knee: Secondary | ICD-10-CM | POA: Diagnosis not present

## 2018-03-16 DIAGNOSIS — Z96651 Presence of right artificial knee joint: Secondary | ICD-10-CM | POA: Diagnosis not present

## 2018-03-16 DIAGNOSIS — Z96653 Presence of artificial knee joint, bilateral: Secondary | ICD-10-CM | POA: Diagnosis not present

## 2018-03-18 DIAGNOSIS — M25562 Pain in left knee: Secondary | ICD-10-CM | POA: Diagnosis not present

## 2018-03-21 DIAGNOSIS — M25562 Pain in left knee: Secondary | ICD-10-CM | POA: Diagnosis not present

## 2018-03-28 DIAGNOSIS — M25562 Pain in left knee: Secondary | ICD-10-CM | POA: Diagnosis not present

## 2018-03-29 DIAGNOSIS — M25562 Pain in left knee: Secondary | ICD-10-CM | POA: Diagnosis not present

## 2018-03-31 DIAGNOSIS — M25562 Pain in left knee: Secondary | ICD-10-CM | POA: Diagnosis not present

## 2018-04-04 DIAGNOSIS — M25562 Pain in left knee: Secondary | ICD-10-CM | POA: Diagnosis not present

## 2018-04-06 DIAGNOSIS — M25562 Pain in left knee: Secondary | ICD-10-CM | POA: Diagnosis not present

## 2018-04-08 DIAGNOSIS — M25562 Pain in left knee: Secondary | ICD-10-CM | POA: Diagnosis not present

## 2018-04-11 DIAGNOSIS — M25562 Pain in left knee: Secondary | ICD-10-CM | POA: Diagnosis not present

## 2018-04-13 DIAGNOSIS — M25562 Pain in left knee: Secondary | ICD-10-CM | POA: Diagnosis not present

## 2018-04-15 DIAGNOSIS — M25562 Pain in left knee: Secondary | ICD-10-CM | POA: Diagnosis not present

## 2018-04-18 DIAGNOSIS — Z471 Aftercare following joint replacement surgery: Secondary | ICD-10-CM | POA: Diagnosis not present

## 2018-04-18 DIAGNOSIS — Z96652 Presence of left artificial knee joint: Secondary | ICD-10-CM | POA: Diagnosis not present

## 2018-04-19 DIAGNOSIS — M25562 Pain in left knee: Secondary | ICD-10-CM | POA: Diagnosis not present

## 2018-04-20 DIAGNOSIS — M25562 Pain in left knee: Secondary | ICD-10-CM | POA: Diagnosis not present

## 2018-04-22 DIAGNOSIS — M25562 Pain in left knee: Secondary | ICD-10-CM | POA: Diagnosis not present

## 2018-06-03 ENCOUNTER — Encounter: Payer: Self-pay | Admitting: Family Medicine

## 2018-06-03 ENCOUNTER — Ambulatory Visit (INDEPENDENT_AMBULATORY_CARE_PROVIDER_SITE_OTHER): Payer: Medicare Other | Admitting: Family Medicine

## 2018-06-03 VITALS — BP 138/78 | HR 82 | Temp 97.8°F | Ht 59.0 in | Wt 178.0 lb

## 2018-06-03 DIAGNOSIS — R400 Somnolence: Secondary | ICD-10-CM

## 2018-06-03 DIAGNOSIS — Z72820 Sleep deprivation: Secondary | ICD-10-CM

## 2018-06-03 DIAGNOSIS — M170 Bilateral primary osteoarthritis of knee: Secondary | ICD-10-CM

## 2018-06-03 DIAGNOSIS — N3941 Urge incontinence: Secondary | ICD-10-CM

## 2018-06-03 DIAGNOSIS — Z23 Encounter for immunization: Secondary | ICD-10-CM | POA: Diagnosis not present

## 2018-06-03 DIAGNOSIS — I10 Essential (primary) hypertension: Secondary | ICD-10-CM | POA: Diagnosis not present

## 2018-06-03 DIAGNOSIS — G8929 Other chronic pain: Secondary | ICD-10-CM | POA: Diagnosis not present

## 2018-06-03 DIAGNOSIS — M2550 Pain in unspecified joint: Secondary | ICD-10-CM

## 2018-06-03 DIAGNOSIS — R0683 Snoring: Secondary | ICD-10-CM | POA: Diagnosis not present

## 2018-06-03 MED ORDER — CELECOXIB 200 MG PO CAPS: 200.0000 mg | ORAL_CAPSULE | Freq: Every day | ORAL | 1 refills | Status: DC

## 2018-06-03 MED ORDER — FLUCONAZOLE 150 MG PO TABS: 150.0000 mg | ORAL_TABLET | Freq: Once | ORAL | 2 refills | Status: AC

## 2018-06-03 NOTE — Patient Instructions (Addendum)
Good to see you today  I will notify you of lab results  Follow up in 6 months for Medicare Annual Wellness Visit and follow up visit   Kegel Exercises Kegel exercises help strengthen the muscles that support the rectum, vagina, small intestine, bladder, and uterus. Doing Kegel exercises can help:  Improve bladder and bowel control.  Improve sexual response.  Reduce problems and discomfort during pregnancy.  Kegel exercises involve squeezing your pelvic floor muscles, which are the same muscles you squeeze when you try to stop the flow of urine. The exercises can be done while sitting, standing, or lying down, but it is best to vary your position. Phase 1 exercises 1. Squeeze your pelvic floor muscles tight. You should feel a tight lift in your rectal area. If you are a female, you should also feel a tightness in your vaginal area. Keep your stomach, buttocks, and legs relaxed. 2. Hold the muscles tight for up to 10 seconds. 3. Relax your muscles. Repeat this exercise 50 times a day or as many times as told by your health care provider. Continue to do this exercise for at least 4-6 weeks or for as long as told by your health care provider. This information is not intended to replace advice given to you by your health care provider. Make sure you discuss any questions you have with your health care provider. Document Released: 08/10/2012 Document Revised: 04/18/2016 Document Reviewed: 07/14/2015 Elsevier Interactive Patient Education  [employer].

## 2018-06-03 NOTE — Progress Notes (Signed)
Subjective:    Patient ID: Christina Meadows, female    DOB: [birth year], 67 y.o.   MRN: [id number]  HPI This is a 67 yo female who presents today for follow up of HTN and s/p knee replacement. Doing better. Requires a tramadol at bedtime for sleep. Was on gabapentin and aspirin- finished with post op course. Back and hip pain at night, very stiff, some improvement with heating pad.  Has multiple family members with RA, has always wondered if her polyarthritis is inflammatory.   Chronic fatigue, sleeps poorly, has loud snoring.   No chest pain, no SOB, no diarrhea/constipation/abdominal pain, urge incontinence  Past Medical History:  Diagnosis Date  . Arthritis   . Hyperlipidemia   . Hypertension    Past Surgical History:  Procedure Laterality Date  . ABDOMINAL HYSTERECTOMY  2000  . COLONOSCOPY    . COLONOSCOPY WITH PROPOFOL N/A 07/06/2017   Procedure: COLONOSCOPY WITH PROPOFOL;  Surgeon: Robert Bellow, MD;  Location: [facility] ENDOSCOPY;  Service: Endoscopy;  Laterality: N/A;  . UPPER GI ENDOSCOPY     Family History  Problem Relation Age of Onset  . Lung cancer Mother 62  . Diabetes Father   . COPD Father   . Arthritis Father   . Colon cancer Brother 16  . Arthritis Brother   . Diabetes Brother   . Early death Brother   . Hearing loss Brother   . Heart disease Brother   . Hyperlipidemia Brother   . Hypertension Brother   . Throat cancer Maternal Grandmother    Social History   Tobacco Use  . Smoking status: Former Smoker    Types: Cigarettes    Last attempt to quit: 09/07/1978    Years since quitting: 39.7  . Smokeless tobacco: Never Used  Substance Use Topics  . Alcohol use: Yes  . Drug use: No      Review of Systems Per HPI    Objective:   Physical Exam  Constitutional: She is oriented to person, place, and time. She appears well-developed and well-nourished. No distress.  Cardiovascular: Normal rate, regular rhythm and normal heart sounds.    Pulmonary/Chest: Effort normal and breath sounds normal.  Neurological: She is alert and oriented to person, place, and time.  Skin: Skin is warm and dry. She is not diaphoretic.  Well healed bilateral knee incisions.   Vitals reviewed.    BP 138/78 (BP Location: Right Arm, Patient Position: Sitting, Cuff Size: Normal)   Pulse 82   Temp 97.8 F (36.6 C) (Oral)   Ht 4\' 11"  (1.499 m)   Wt 178 lb (80.7 kg)   SpO2 98%   BMI 35.95 kg/m  Wt Readings from Last 3 Encounters:  06/03/18 178 lb (80.7 kg)  12/01/17 174 lb 8 oz (79.2 kg)  11/10/17 176 lb 4 oz (79.9 kg)       Assessment & Plan:  1. Bilateral primary osteoarthritis of knee - has had good relief with celecoxib in past - celecoxib (CELEBREX) 200 MG capsule; Take 1 capsule (200 mg total) by mouth daily.  Dispense: 90 capsule; Refill: 1  2. Loud snoring - ? OSA, will refer to pulmonary for possible sleep study - Ambulatory referral to Pulmonology  3. Poor sleep - Ambulatory referral to Pulmonology  4. Daytime sleepiness - Ambulatory referral to Pulmonology  5. Chronic pain of multiple joints - Sedimentation rate - Rheumatoid factor; Future  6. Essential hypertension - Basic Metabolic Panel - CBC with Differential  7.  Need for influenza vaccination - Flu vaccine HIGH DOSE PF  8. Urge incontinence - encouraged her to do regular kegel exercises  - follow up in 6 months  Clarene Reamer, FNP-BC  [facility], [place]  06/06/2018 8:55 PM

## 2018-06-04 LAB — CBC WITH DIFFERENTIAL/PLATELET
BASOS ABS: 56 {cells}/uL (ref 0–200)
Basophils Relative: 0.6 %
EOS PCT: 2.1 %
Eosinophils Absolute: 197 cells/uL (ref 15–500)
HEMATOCRIT: 42.2 % (ref 35.0–45.0)
Hemoglobin: 14.4 g/dL (ref 11.7–15.5)
LYMPHS ABS: 2679 {cells}/uL (ref 850–3900)
MCH: 29.7 pg (ref 27.0–33.0)
MCHC: 34.1 g/dL (ref 32.0–36.0)
MCV: 87 fL (ref 80.0–100.0)
MPV: 12 fL (ref 7.5–12.5)
Monocytes Relative: 8.8 %
NEUTROS PCT: 60 %
Neutro Abs: 5640 cells/uL (ref 1500–7800)
Platelets: 240 10*3/uL (ref 140–400)
RBC: 4.85 10*6/uL (ref 3.80–5.10)
RDW: 13.1 % (ref 11.0–15.0)
Total Lymphocyte: 28.5 %
WBC mixed population: 827 cells/uL (ref 200–950)
WBC: 9.4 10*3/uL (ref 3.8–10.8)

## 2018-06-04 LAB — BASIC METABOLIC PANEL
BUN: 10 mg/dL (ref 7–25)
CALCIUM: 10.2 mg/dL (ref 8.6–10.4)
CO2: 31 mmol/L (ref 20–32)
Chloride: 99 mmol/L (ref 98–110)
Creat: 0.6 mg/dL (ref 0.50–0.99)
Glucose, Bld: 88 mg/dL (ref 65–99)
POTASSIUM: 4.3 mmol/L (ref 3.5–5.3)
SODIUM: 140 mmol/L (ref 135–146)

## 2018-06-04 LAB — SEDIMENTATION RATE: Sed Rate: 6 mm/h (ref 0–30)

## 2018-06-06 ENCOUNTER — Encounter: Payer: Self-pay | Admitting: Family Medicine

## 2018-06-15 ENCOUNTER — Ambulatory Visit (INDEPENDENT_AMBULATORY_CARE_PROVIDER_SITE_OTHER): Payer: Medicare Other | Admitting: Internal Medicine

## 2018-06-15 ENCOUNTER — Other Ambulatory Visit (INDEPENDENT_AMBULATORY_CARE_PROVIDER_SITE_OTHER): Payer: Medicare Other

## 2018-06-15 ENCOUNTER — Encounter: Payer: Self-pay | Admitting: Internal Medicine

## 2018-06-15 VITALS — BP 126/70 | HR 74 | Resp 16 | Ht 59.0 in | Wt 176.0 lb

## 2018-06-15 DIAGNOSIS — G8929 Other chronic pain: Secondary | ICD-10-CM | POA: Diagnosis not present

## 2018-06-15 DIAGNOSIS — G4719 Other hypersomnia: Secondary | ICD-10-CM | POA: Diagnosis not present

## 2018-06-15 DIAGNOSIS — M2550 Pain in unspecified joint: Secondary | ICD-10-CM

## 2018-06-15 NOTE — Patient Instructions (Addendum)
Will need to obtain results from your most recent home sleep test at [facility].  Based on these results we can make further recommendations.  Sleep Apnea    Sleep apnea is disorder that affects a person's sleep. A person with sleep apnea has abnormal pauses in their breathing when they sleep. It is hard for them to get a good sleep. This makes a person tired during the day. It also can lead to other physical problems. There are three types of sleep apnea. One type is when breathing stops for a short time because your airway is blocked (obstructive sleep apnea). Another type is when the brain sometimes fails to give the normal signal to breathe to the muscles that control your breathing (central sleep apnea). The third type is a combination of the other two types.  HOME CARE   Take all medicine as told by your doctor.  Avoid alcohol, calming medicines (sedatives), and depressant drugs.  Try to lose weight if you are overweight. Talk to your doctor about a healthy weight goal.  Your doctor may have you use a device that helps to open your airway. It can help you get the air that you need. It is called a positive airway pressure (PAP) device.   MAKE SURE YOU:   Understand these instructions.  Will watch your condition.  Will get help right away if you are not doing well or get worse.  It may take approximately 1 month for you to get used to wearing her CPAP every night.  Be sure to work with your machine to get used to it, be patient, it may take time!  If you have trouble tolerating CPAP DO NOT RETURN YOUR MACHINE; Contact our office to see if we can help you tolerate the CPAP better first!

## 2018-06-15 NOTE — Progress Notes (Addendum)
[facility] Consultation      Assessment and Plan:  Excessive daytime sleepiness. -Symptoms and signs of obstructive sleep apnea.  Patient had a home sleep test, these results are not available currently. - We will get copy of home sleep study results and make further recommendations for CPAP versus in lab sleep study CPAP titration.  Addendum 06/16/2018: Review of outside home sleep test performed on 10/19/2017.  AHI was 3.8, negative for sleep apnea, patient spent most of the time in the nonsupine position.  Recommend home sleep test, patient states try to stay in the supine position.  Tonsillar hypertrophy. - Enlarged tonsils which may contribute to obstructive sleep apnea. - Patient may benefit from tonsillectomy if she has sleep apnea which cannot be treated with CPAP.  Essential hypertension. - Obstructive sleep apnea can contribute to essential hypertension, therefore treatment of sleep apnea is important part of hypertension management.  Return in about 3 months (around 09/15/2018).   Date: 06/15/2018  MRN# [id number] ARIDAY BRINKER Dec 07, 1950   Titus Mould is a 67 y.o. old female seen in consultation for chief complaint of:    Chief Complaint  Patient presents with  . Consult    Referred by D. Gessner for eval of daytime sleepiness, snoring and poor sleep.  . restless sleep    pt states she toss and turn all night.    HPI:   The patient is a 67 year old female presents with complaints of daytime sleepiness and disrupted sleep.  She usually goes to bed between 10 and 11 PM, she falls asleep within 30 minutes, she gets out of bed at 6:45 AM. She has had 2 knee replacements this year, and during those times she had loud snoring, witnessed apneas. She continues to have issues where she wakes up multiple times per night, she wakes in the AM with morning headaches. When she wakes her throat is sore and she feels that she is gasping.  She has multiple  siblings with OSA, some are on CPAP.  She is exhausted during the day, but she does not have to nap. But if she does lay down she will fall asleep very quickly.  She had a HST this past year, she was recommended to go back to the sleep lab, I presume for the CPAP titration.  In epic there is a heading under [facility] unattended sleep study 10/28/2017, however results are not available.  PMHX:   Past Medical History:  Diagnosis Date  . Arthritis   . Hyperlipidemia   . Hypertension    Surgical Hx:  Past Surgical History:  Procedure Laterality Date  . ABDOMINAL HYSTERECTOMY  2000  . COLONOSCOPY    . COLONOSCOPY WITH PROPOFOL N/A 07/06/2017   Procedure: COLONOSCOPY WITH PROPOFOL;  Surgeon: Robert Bellow, MD;  Location: [facility] ENDOSCOPY;  Service: Endoscopy;  Laterality: N/A;  . UPPER GI ENDOSCOPY     Family Hx:  Family History  Problem Relation Age of Onset  . Lung cancer Mother 38  . Diabetes Father   . COPD Father   . Arthritis Father   . Colon cancer Brother 76  . Arthritis Brother   . Diabetes Brother   . Early death Brother   . Hearing loss Brother   . Heart disease Brother   . Hyperlipidemia Brother   . Hypertension Brother   . Throat cancer Maternal Grandmother    Social Hx:   Social History   Tobacco Use  . Smoking status: Former  Smoker    Types: Cigarettes    Last attempt to quit: 09/07/1978    Years since quitting: 39.7  . Smokeless tobacco: Never Used  Substance Use Topics  . Alcohol use: Yes  . Drug use: No   Medication:    Current Outpatient Medications:  .  acetaminophen (TYLENOL) 500 MG tablet, Take 1,000 mg by mouth daily., Disp: , Rfl:  .  celecoxib (CELEBREX) 200 MG capsule, Take 1 capsule (200 mg total) by mouth daily., Disp: 90 capsule, Rfl: 1 .  cetirizine (ZYRTEC) 10 MG tablet, Take by mouth., Disp: , Rfl:  .  citalopram (CELEXA) 20 MG tablet, Take 1 tablet (20 mg total) by mouth daily., Disp: 90 tablet, Rfl: 1 .  fluticasone (FLONASE) 50  MCG/ACT nasal spray, Place 2 sprays into both nostrils daily., Disp: 16 g, Rfl: 5 .  lisinopril-hydrochlorothiazide (PRINZIDE,ZESTORETIC) 20-25 MG tablet, Take 1 tablet by mouth daily., Disp: 90 tablet, Rfl: 1 .  montelukast (SINGULAIR) 10 MG tablet, Take 1 tablet (10 mg total) by mouth at bedtime., Disp: 90 tablet, Rfl: 3 .  traMADol (ULTRAM) 50 MG tablet, Take 50 mg by mouth as needed. , Disp: , Rfl:    Allergies:  Patient has no known allergies.  Review of Systems: Gen:  Denies  fever, sweats, chills HEENT: Denies blurred vision, double vision. bleeds, sore throat Cvc:  No dizziness, chest pain. Resp:   Denies cough or sputum production, shortness of breath Gi: Denies swallowing difficulty, stomach pain. Gu:  Denies bladder incontinence, burning urine Ext:   No Joint pain, stiffness. Skin: No skin rash,  hives  Endoc:  No polyuria, polydipsia. Psych: No depression, insomnia. Other:  All other systems were reviewed with the patient and were negative other that what is mentioned in the HPI.   Physical Examination:   VS: BP 126/70 (BP Location: Left Arm, Cuff Size: Normal)   Pulse 74   Resp 16   Ht 4\' 11"  (1.499 m)   Wt 176 lb (79.8 kg)   SpO2 96%   BMI 35.55 kg/m   General Appearance: No distress  Neuro:without focal findings,  speech normal,  HEENT: PERRLA, EOM intact.  Tonsillar enlargement.  Mallampati 2. Pulmonary: normal breath sounds, No wheezing.  CardiovascularNormal S1,S2.  No m/r/g.   Abdomen: Benign, Soft, non-tender. Renal:  No costovertebral tenderness  GU:  No performed at this time. Endoc: No evident thyromegaly, no signs of acromegaly. Skin:   warm, no rashes, no ecchymosis  Extremities: normal, no cyanosis, clubbing.  Other findings:    LABORATORY PANEL:   CBC No results for input(s): WBC, HGB, HCT, PLT in the last 168  hours. ------------------------------------------------------------------------------------------------------------------  Chemistries  No results for input(s): NA, K, CL, CO2, GLUCOSE, BUN, CREATININE, CALCIUM, MG, AST, ALT, ALKPHOS, BILITOT in the last 168 hours.  Invalid input(s): GFRCGP ------------------------------------------------------------------------------------------------------------------  Cardiac Enzymes No results for input(s): TROPONINI in the last 168 hours. ------------------------------------------------------------  RADIOLOGY:  No results found.     Thank  you for the consultation and for allowing [facility] to assist in the care of your patient. Our recommendations are noted above.  Please contact us if we can be of further service.   Marda Stalker, M.D., F.C.C.P.  Board Certified in Internal Medicine, Pulmonary Medicine, [place], and Sleep Medicine.  [facility] Office Number: [phone]   06/15/2018

## 2018-06-16 LAB — RHEUMATOID FACTOR: Rhuematoid fact SerPl-aCnc: <14 IU/mL (ref <14)

## 2018-06-17 ENCOUNTER — Other Ambulatory Visit: Payer: Self-pay | Admitting: *Deleted

## 2018-06-17 ENCOUNTER — Telehealth: Payer: Self-pay | Admitting: *Deleted

## 2018-06-17 DIAGNOSIS — G4719 Other hypersomnia: Secondary | ICD-10-CM

## 2018-06-17 NOTE — Progress Notes (Signed)
Orders placed HST Pt aware

## 2018-06-17 NOTE — Telephone Encounter (Signed)
Pt aware last sleep study neg. She is aware HST being ordered and she will receive a call to schedule after approved.

## 2018-06-17 NOTE — Telephone Encounter (Signed)
-----   Message from Laverle Hobby, MD sent at 06/16/2018 10:31 AM EDT ----- Regarding: needs HST Reviewed outside sleep study which was negative but she did not sleep supine. Will need HST, pt will need to sleep in supine position.

## 2018-07-06 ENCOUNTER — Encounter: Payer: Self-pay | Admitting: Internal Medicine

## 2018-07-06 DIAGNOSIS — Z96652 Presence of left artificial knee joint: Secondary | ICD-10-CM | POA: Diagnosis not present

## 2018-07-06 DIAGNOSIS — Z96653 Presence of artificial knee joint, bilateral: Secondary | ICD-10-CM | POA: Diagnosis not present

## 2018-07-06 DIAGNOSIS — Z96651 Presence of right artificial knee joint: Secondary | ICD-10-CM | POA: Diagnosis not present

## 2018-07-06 DIAGNOSIS — M7061 Trochanteric bursitis, right hip: Secondary | ICD-10-CM | POA: Diagnosis not present

## 2018-07-06 DIAGNOSIS — Z09 Encounter for follow-up examination after completed treatment for conditions other than malignant neoplasm: Secondary | ICD-10-CM | POA: Diagnosis not present

## 2018-07-06 DIAGNOSIS — G4719 Other hypersomnia: Secondary | ICD-10-CM

## 2018-07-06 DIAGNOSIS — Z471 Aftercare following joint replacement surgery: Secondary | ICD-10-CM | POA: Diagnosis not present

## 2018-07-06 DIAGNOSIS — G4733 Obstructive sleep apnea (adult) (pediatric): Secondary | ICD-10-CM | POA: Diagnosis not present

## 2018-07-08 DIAGNOSIS — G4733 Obstructive sleep apnea (adult) (pediatric): Secondary | ICD-10-CM | POA: Diagnosis not present

## 2018-07-12 ENCOUNTER — Telehealth: Payer: Self-pay | Admitting: Internal Medicine

## 2018-07-12 DIAGNOSIS — G4733 Obstructive sleep apnea (adult) (pediatric): Secondary | ICD-10-CM

## 2018-07-12 NOTE — Telephone Encounter (Signed)
LMTCB no vmail on preferred #.

## 2018-07-13 NOTE — Telephone Encounter (Signed)
Spoke to patient re:HST results. Patient aware.

## 2018-07-13 NOTE — Telephone Encounter (Signed)
Patient calling to discuss recent testing results  ° °Please call  ° °

## 2018-07-17 DIAGNOSIS — J019 Acute sinusitis, unspecified: Secondary | ICD-10-CM | POA: Diagnosis not present

## 2018-07-18 NOTE — Telephone Encounter (Signed)
[facility] calling States that Home Sleep Study needs doctor signature Once signed please fax back to [facility]

## 2018-07-19 NOTE — Telephone Encounter (Signed)
Christina Meadows from [facility] calling Checking the status of the doctor signature for Home Sleep Study Please advise

## 2018-07-19 NOTE — Telephone Encounter (Signed)
Patient returning call to discuss getting a cpap with Suanne Marker

## 2018-07-20 NOTE — Telephone Encounter (Signed)
Signed HST faxed to [place] at [facility]. Confirmation via fax that HST was sent successfully to [facility]. Rhonda J Cobb

## 2018-09-04 ENCOUNTER — Encounter: Payer: Self-pay | Admitting: Internal Medicine

## 2018-09-08 ENCOUNTER — Encounter: Payer: Self-pay | Admitting: Internal Medicine

## 2018-09-13 ENCOUNTER — Encounter: Payer: Self-pay | Admitting: Internal Medicine

## 2018-09-13 ENCOUNTER — Ambulatory Visit (INDEPENDENT_AMBULATORY_CARE_PROVIDER_SITE_OTHER): Payer: Medicare Other | Admitting: Internal Medicine

## 2018-09-13 VITALS — BP 136/62 | HR 84 | Ht 59.0 in | Wt 171.8 lb

## 2018-09-13 DIAGNOSIS — G4733 Obstructive sleep apnea (adult) (pediatric): Secondary | ICD-10-CM

## 2018-09-13 NOTE — Addendum Note (Signed)
Addended by: Darreld Mclean on: 09/13/2018 04:06 PM   Modules accepted: Orders

## 2018-09-13 NOTE — Progress Notes (Signed)
[facility] Consultation      Assessment and Plan:  Obstructive sleep apnea. -Doing well with CPAP, will adjust pressure to range of 8-15 cm H2O.  Tonsillar hypertrophy. - Enlarged tonsils which may contribute to obstructive sleep apnea. - Patient may benefit from tonsillectomy if she has sleep apnea which cannot be treated with CPAP.  Essential hypertension. - Obstructive sleep apnea can contribute to essential hypertension, therefore treatment of sleep apnea is important part of hypertension management.  Return in about 1 year (around 09/14/2019).   Date: 09/13/2018  MRN# [id number] Christina Meadows 02-07-1951   Christina Meadows is a 68 y.o. old female seen in consultation for chief complaint of:    Chief Complaint  Patient presents with  . Follow-up    doing well with CPAP-now has congestion and not able to use    HPI:   The patient is a 68 year old female presents with complaints of daytime sleepiness and disrupted sleep. She has multiple siblings with OSA, some are on CPAP.  At last visit she was noted to have significant daytime sleepiness, she had a previous HST in February 2019 which showed an AHI of 3, however symptoms have progressed since that time, and she had not slept on her back for that study.  She was therefore sent for repeat HST which showed an AHI of 28. She was started on CPAP and feels that she is doing well with it. She is more awake during the day, she is no longer sleepy during the day and has more energy.    She had a HST this past year, she was recommended to go back to the sleep lab, I presume for the CPAP titration.  In epic there is a heading under [facility] unattended sleep study 10/28/2017, however results are not available.  **CPAP download 08/10/2018-09/08/2018>> raw data personally reviewed, usage greater than 4 hours is 30/30 days.  Average usage on days used is 8 hours 3 minutes.  Ranges auto 5-20.  Median pressure 8.5, 95th  percentile pressure 11, maximum pressure 13.  Residual AHI is 1.2.  Leaks are within normal limits.  Overall this shows excellent compliance with CPAP with excellent control of obstructive sleep apnea. **HST 07/06/2018>> moderate obstructive sleep apnea with AHI of 28.  Recommended auto CPAP with pressure range 5-20.  Medication:    Current Outpatient Medications:  .  acetaminophen (TYLENOL) 500 MG tablet, Take 1,000 mg by mouth daily., Disp: , Rfl:  .  celecoxib (CELEBREX) 200 MG capsule, Take 1 capsule (200 mg total) by mouth daily., Disp: 90 capsule, Rfl: 1 .  cetirizine (ZYRTEC) 10 MG tablet, Take by mouth., Disp: , Rfl:  .  citalopram (CELEXA) 20 MG tablet, Take 1 tablet (20 mg total) by mouth daily., Disp: 90 tablet, Rfl: 1 .  fluticasone (FLONASE) 50 MCG/ACT nasal spray, Place 2 sprays into both nostrils daily., Disp: 16 g, Rfl: 5 .  lisinopril-hydrochlorothiazide (PRINZIDE,ZESTORETIC) 20-25 MG tablet, Take 1 tablet by mouth daily., Disp: 90 tablet, Rfl: 1 .  montelukast (SINGULAIR) 10 MG tablet, Take 1 tablet (10 mg total) by mouth at bedtime., Disp: 90 tablet, Rfl: 3 .  traMADol (ULTRAM) 50 MG tablet, Take 50 mg by mouth as needed. , Disp: , Rfl:    Allergies:  Patient has no known allergies.   Review of Systems:  Constitutional: Feels well. Cardiovascular: Denies chest pain, exertional chest pain.  Pulmonary: Denies hemoptysis, pleuritic chest pain.   The remainder of systems were  reviewed and were found to be negative other than what is documented in the HPI.    Physical Examination:   VS: BP 136/62 (BP Location: Left Arm, Cuff Size: Normal)   Pulse 84   Ht 4\' 11"  (1.499 m)   Wt 171 lb 12.8 oz (77.9 kg)   SpO2 99%   BMI 34.70 kg/m   General Appearance: No distress  Neuro:without focal findings, mental status, speech normal, alert and oriented HEENT: PERRLA, EOM intact Pulmonary: No wheezing, No rales  CardiovascularNormal S1,S2.  No m/r/g.  Abdomen: Benign, Soft,  non-tender, No masses Renal:  No costovertebral tenderness  GU:  No performed at this time. Endoc: No evident thyromegaly, no signs of acromegaly or Cushing features Skin:   warm, no rashes, no ecchymosis  Extremities: normal, no cyanosis, clubbing.    LABORATORY PANEL:   CBC No results for input(s): WBC, HGB, HCT, PLT in the last 168 hours. ------------------------------------------------------------------------------------------------------------------  Chemistries  No results for input(s): NA, K, CL, CO2, GLUCOSE, BUN, CREATININE, CALCIUM, MG, AST, ALT, ALKPHOS, BILITOT in the last 168 hours.  Invalid input(s): GFRCGP ------------------------------------------------------------------------------------------------------------------  Cardiac Enzymes No results for input(s): TROPONINI in the last 168 hours. ------------------------------------------------------------  RADIOLOGY:  No results found.     Thank  you for the consultation and for allowing [facility] to assist in the care of your patient. Our recommendations are noted above.  Please contact us if we can be of further service.   Marda Stalker, M.D., F.C.C.P.  Board Certified in Internal Medicine, Pulmonary Medicine, [place], and Sleep Medicine.  [facility] Office Number: [phone]   09/13/2018

## 2018-09-13 NOTE — Patient Instructions (Addendum)
Will adjust pressure to 8-15 cm H2O.  Continue using your cpap every night.

## 2018-10-01 ENCOUNTER — Other Ambulatory Visit: Payer: Self-pay | Admitting: Family Medicine

## 2018-10-01 DIAGNOSIS — F339 Major depressive disorder, recurrent, unspecified: Secondary | ICD-10-CM

## 2018-11-21 DIAGNOSIS — H2513 Age-related nuclear cataract, bilateral: Secondary | ICD-10-CM | POA: Diagnosis not present

## 2018-11-21 DIAGNOSIS — H43811 Vitreous degeneration, right eye: Secondary | ICD-10-CM | POA: Diagnosis not present

## 2018-12-11 ENCOUNTER — Other Ambulatory Visit: Payer: Self-pay | Admitting: Family Medicine

## 2018-12-11 DIAGNOSIS — I10 Essential (primary) hypertension: Secondary | ICD-10-CM

## 2018-12-12 NOTE — Telephone Encounter (Signed)
Last office visit 06/03/2018.  AVS states to follow up in 6 months for AWV.  No future appointments.  Do you want to schedule WebEx for medication refill or refill?

## 2018-12-12 NOTE — Telephone Encounter (Signed)
I left msg for pt to return my call

## 2018-12-12 NOTE — Telephone Encounter (Signed)
Please see if she is able to do a virtual visit.

## 2018-12-14 ENCOUNTER — Ambulatory Visit (INDEPENDENT_AMBULATORY_CARE_PROVIDER_SITE_OTHER): Payer: Medicare Other | Admitting: Family Medicine

## 2018-12-14 ENCOUNTER — Encounter: Payer: Self-pay | Admitting: Family Medicine

## 2018-12-14 DIAGNOSIS — I10 Essential (primary) hypertension: Secondary | ICD-10-CM

## 2018-12-14 DIAGNOSIS — J309 Allergic rhinitis, unspecified: Secondary | ICD-10-CM | POA: Diagnosis not present

## 2018-12-14 DIAGNOSIS — F339 Major depressive disorder, recurrent, unspecified: Secondary | ICD-10-CM

## 2018-12-14 MED ORDER — FLUTICASONE PROPIONATE 50 MCG/ACT NA SUSP: 2.0000 | Freq: Every day | NASAL | 5 refills | Status: AC

## 2018-12-14 MED ORDER — CITALOPRAM HYDROBROMIDE 20 MG PO TABS: 20.0000 mg | ORAL_TABLET | Freq: Every day | ORAL | 3 refills | Status: DC

## 2018-12-14 MED ORDER — LISINOPRIL-HYDROCHLOROTHIAZIDE 20-25 MG PO TABS: 1.0000 | ORAL_TABLET | Freq: Every day | ORAL | 3 refills | Status: DC

## 2018-12-14 MED ORDER — MONTELUKAST SODIUM 10 MG PO TABS: 10.0000 mg | ORAL_TABLET | Freq: Every day | ORAL | 3 refills | Status: DC

## 2018-12-14 NOTE — Telephone Encounter (Signed)
This patient was seen in office 4/8- I do not have the security to complete.

## 2018-12-14 NOTE — Patient Instructions (Signed)
Hi Christina Meadows, It was nice to talk with you today. I refilled your medications to your pharmacy today. Please resume your fluticasone nasal spray.  You can use it in the morning before brushing your teeth.  I have added some information about allergies below. Go ahead and schedule your mammogram for the summer.  If they need an order please let me know Warm regards,  Tor Netters, FNP-BC   Allergic Rhinitis, Adult Allergic rhinitis is an allergic reaction that affects the mucous membrane inside the nose. It causes sneezing, a runny or stuffy nose, and the feeling of mucus going down the back of the throat (postnasal drip). Allergic rhinitis can be mild to severe. There are two types of allergic rhinitis:  Seasonal. This type is also called hay fever. It happens only during certain seasons.  Perennial. This type can happen at any time of the year. What are the causes? This condition happens when the body's defense system (immune system) responds to certain harmless substances called allergens as though they were germs.  Seasonal allergic rhinitis is triggered by pollen, which can come from grasses, trees, and weeds. Perennial allergic rhinitis may be caused by:  House dust mites.  Pet dander.  Mold spores. What are the signs or symptoms? Symptoms of this condition include:  Sneezing.  Runny or stuffy nose (nasal congestion).  Postnasal drip.  Itchy nose.  Tearing of the eyes.  Trouble sleeping.  Daytime sleepiness. How is this diagnosed? This condition may be diagnosed based on:  Your medical history.  A physical exam.  Tests to check for related conditions, such as: ? Asthma. ? Pink eye. ? Ear infection. ? Upper respiratory infection.  Tests to find out which allergens trigger your symptoms. These may include skin or blood tests. How is this treated? There is no cure for this condition, but treatment can help control symptoms. Treatment may include:  Taking  medicines that block allergy symptoms, such as antihistamines. Medicine may be given as a shot, nasal spray, or pill.  Avoiding the allergen.  Desensitization. This treatment involves getting ongoing shots until your body becomes less sensitive to the allergen. This treatment may be done if other treatments do not help.  If taking medicine and avoiding the allergen does not work, new, stronger medicines may be prescribed. Follow these instructions at home:  Find out what you are allergic to. Common allergens include smoke, dust, and pollen.  Avoid the things you are allergic to. These are some things you can do to help avoid allergens: ? Replace carpet with wood, tile, or vinyl flooring. Carpet can trap dander and dust. ? Do not smoke. Do not allow smoking in your home. ? Change your heating and air conditioning filter at least once a month. ? During allergy season:  Keep windows closed as much as possible.  Plan outdoor activities when pollen counts are lowest. This is usually during the evening hours.  When coming indoors, change clothing and shower before sitting on furniture or bedding.  Take over-the-counter and prescription medicines only as told by your health care provider.  Keep all follow-up visits as told by your health care provider. This is important. Contact a health care provider if:  You have a fever.  You develop a persistent cough.  You make whistling sounds when you breathe (you wheeze).  Your symptoms interfere with your normal daily activities. Get help right away if:  You have shortness of breath. Summary  This condition can be managed by  taking medicines as directed and avoiding allergens.  Contact your health care provider if you develop a persistent cough or fever.  During allergy season, keep windows closed as much as possible. This information is not intended to replace advice given to you by your health care provider. Make sure you discuss any  questions you have with your health care provider. Document Released: 05/19/2001 Document Revised: 10/01/2016 Document Reviewed: 10/01/2016 Elsevier Interactive Patient Education  2019 [employer].

## 2018-12-14 NOTE — Progress Notes (Signed)
Virtual Visit via Telephone Note  I connected with Christina Meadows on 12/14/18 at 10:30 AM EDT by telephone and verified that I am speaking with the correct person using two identifiers.   I discussed the limitations, risks, and privacy concerns of performing an evaluation and management service by telephone and the availability of in person appointments. I also discussed with the patient that there may be a patient responsible charge related to this service. The patient expressed understanding and agreed to proceed.   History of Present Illness: This is a 68 yo female who agrees to a phone visit for follow up of chronic medical conditions.   OSA on CPAP- she reports that her pressure was increased January 2020 and she is sleeping well.  She does have some nasal and mouth dryness when she wakes.  Machine does have humidity.  She sleeps well and has improved energy.  Essential hypertension- she reports checking her blood pressures at home occasionally and they are running less than 140/90.  She denies side effects from lisinopril hydrochlorothiazide.  Depression- she reports that she is managing well on her citalopram 20 mg.  She feels that she is handling COVID pandemic well and is staying busy.  Mood is good.  Bilateral knee pain- left knee is doing great and she has no pain.  Right knee bothers her some at night.  She continues to be maintained on Celebrex 200 mg and occasional acetaminophen.  Her knee does not bother her during the day.   Allergic rhinitis- she continues to take montelukast 10 mg daily.  She has not been using fluticasone.  Some sneezing and watery eyes no congestion no nasal drainage or nosebleeds.   Observations/Objective: Patient appropriately conversive and no signs of respiratory difficulties.  Bright affect.  Assessment and Plan: 1. Essential hypertension - well controlled - lisinopril-hydrochlorothiazide (PRINZIDE,ZESTORETIC) 20-25 MG tablet; Take 1 tablet by  mouth daily.  Dispense: 90 tablet; Refill: 3 - follow up in 6 months   2. Depression, recurrent ([place]) - feels that she is doing well on current dose - citalopram (CELEXA) 20 MG tablet; Take 1 tablet (20 mg total) by mouth daily.  Dispense: 90 tablet; Refill: 3  3. Allergic rhinitis, unspecified seasonality, unspecified trigger - montelukast (SINGULAIR) 10 MG tablet; Take 1 tablet (10 mg total) by mouth at bedtime.  Dispense: 90 tablet; Refill: 3 - fluticasone (FLONASE) 50 MCG/ACT nasal spray; Place 2 sprays into both nostrils daily.  Dispense: 16 g; Refill: 5  -Follow-up in 6 months for labs/BP check  Clarene Reamer, FNP-BC  [facility], [place]  12/14/2018 10:49 AM   Follow Up Instructions: After visit summary mailed to patient   I discussed the assessment and treatment plan with the patient. The patient was provided an opportunity to ask questions and all were answered. The patient agreed with the plan and demonstrated an understanding of the instructions.   The patient was advised to call back or seek an in-person evaluation if the symptoms worsen or if the condition fails to improve as anticipated.  I provided 13 minutes of non-face-to-face time during this encounter.   Elby Beck, FNP

## 2018-12-14 NOTE — Telephone Encounter (Signed)
Please sign and close if completed.

## 2019-02-17 DIAGNOSIS — Z1231 Encounter for screening mammogram for malignant neoplasm of breast: Secondary | ICD-10-CM | POA: Diagnosis not present

## 2019-02-17 LAB — HM MAMMOGRAPHY

## 2019-03-01 DIAGNOSIS — Z96653 Presence of artificial knee joint, bilateral: Secondary | ICD-10-CM | POA: Diagnosis not present

## 2019-03-01 DIAGNOSIS — M25462 Effusion, left knee: Secondary | ICD-10-CM | POA: Diagnosis not present

## 2019-03-01 DIAGNOSIS — M25552 Pain in left hip: Secondary | ICD-10-CM | POA: Diagnosis not present

## 2019-03-01 DIAGNOSIS — Z471 Aftercare following joint replacement surgery: Secondary | ICD-10-CM | POA: Diagnosis not present

## 2019-03-01 DIAGNOSIS — M25551 Pain in right hip: Secondary | ICD-10-CM | POA: Diagnosis not present

## 2019-03-01 DIAGNOSIS — Z96652 Presence of left artificial knee joint: Secondary | ICD-10-CM | POA: Diagnosis not present

## 2019-03-01 DIAGNOSIS — Z96651 Presence of right artificial knee joint: Secondary | ICD-10-CM | POA: Diagnosis not present

## 2019-03-01 DIAGNOSIS — M25461 Effusion, right knee: Secondary | ICD-10-CM | POA: Diagnosis not present

## 2019-04-10 ENCOUNTER — Other Ambulatory Visit: Payer: Self-pay

## 2019-04-27 ENCOUNTER — Other Ambulatory Visit: Payer: Self-pay | Admitting: Family Medicine

## 2019-04-27 ENCOUNTER — Other Ambulatory Visit: Payer: Self-pay

## 2019-04-27 ENCOUNTER — Other Ambulatory Visit (INDEPENDENT_AMBULATORY_CARE_PROVIDER_SITE_OTHER): Payer: Medicare Other

## 2019-04-27 DIAGNOSIS — E785 Hyperlipidemia, unspecified: Secondary | ICD-10-CM | POA: Diagnosis not present

## 2019-04-27 DIAGNOSIS — I10 Essential (primary) hypertension: Secondary | ICD-10-CM

## 2019-04-27 LAB — LIPID PANEL
Cholesterol: 223 mg/dL — ABNORMAL HIGH (ref 0–200)
HDL: 67.6 mg/dL (ref >39.00)
LDL Cholesterol: 128 mg/dL — ABNORMAL HIGH (ref 0–99)
NonHDL: 155.64
Total CHOL/HDL Ratio: 3
Triglycerides: 140 mg/dL (ref 0.0–149.0)
VLDL: 28 mg/dL (ref 0.0–40.0)

## 2019-04-27 LAB — COMPREHENSIVE METABOLIC PANEL
ALT: 15 U/L (ref 0–35)
AST: 17 U/L (ref 0–37)
Albumin: 4.2 g/dL (ref 3.5–5.2)
Alkaline Phosphatase: 87 U/L (ref 39–117)
BUN: 14 mg/dL (ref 6–23)
CO2: 32 mEq/L (ref 19–32)
Calcium: 9.3 mg/dL (ref 8.4–10.5)
Chloride: 97 mEq/L (ref 96–112)
Creatinine, Ser: 0.74 mg/dL (ref 0.40–1.20)
GFR: 78.19 mL/min (ref >60.00)
Glucose, Bld: 93 mg/dL (ref 70–99)
Potassium: 3.9 mEq/L (ref 3.5–5.1)
Sodium: 137 mEq/L (ref 135–145)
Total Bilirubin: 0.4 mg/dL (ref 0.2–1.2)
Total Protein: 6.9 g/dL (ref 6.0–8.3)

## 2019-04-28 ENCOUNTER — Other Ambulatory Visit: Payer: Medicare Other

## 2019-05-01 ENCOUNTER — Ambulatory Visit (INDEPENDENT_AMBULATORY_CARE_PROVIDER_SITE_OTHER): Payer: Medicare Other

## 2019-05-01 VITALS — Ht 59.5 in | Wt 189.0 lb

## 2019-05-01 DIAGNOSIS — Z0000 Encounter for general adult medical examination without abnormal findings: Secondary | ICD-10-CM | POA: Diagnosis not present

## 2019-05-01 NOTE — Progress Notes (Signed)
Subjective:   Christina Meadows is a 68 y.o. female who presents for Medicare Annual (Subsequent) preventive examination.  This visit type was conducted due to national recommendations for restrictions regarding the COVID-19 Pandemic (e.g. social distancing). This format is felt to be most appropriate for this patient at this time. All issues noted in this document were discussed and addressed. No physical exam was performed (except for noted visual exam findings with Video Visits). This patient, [name], has given permission to perform this visit via telephone. Vital signs may be absent or patient reported.  Patient location:  At home  Nurse location:  at home     Review of Systems:  n/a Cardiac Risk Factors include: advanced age (>70men, >47 women);hypertension;obesity (BMI >30kg/m2)     Objective:     Vitals: Ht 4' 11.5" (1.511 m) Comment: per patient  Wt 189 lb (85.7 kg) Comment: per patient  BMI 37.53 kg/m   Body mass index is 37.53 kg/m.  Advanced Directives 05/01/2019 07/06/2017  Does Patient Have a Medical Advance Directive? No No  Would patient like information on creating a medical advance directive? - No - Patient declined  Pre-existing out of facility DNR order (yellow form or pink MOST form) - Yellow form placed in chart (order not valid for inpatient use)    Tobacco Social History   Tobacco Use  Smoking Status Former Smoker  . Types: Cigarettes  . Quit date: 09/07/1978  . Years since quitting: 40.6  Smokeless Tobacco Never Used     Counseling given: Not Answered   Clinical Intake:  Pre-visit preparation completed: Yes  Pain : No/denies pain     Nutritional Status: BMI > 30  Obese Nutritional Risks: None Diabetes: No  How often do you need to have someone help you when you read instructions, pamphlets, or other written materials from your doctor or pharmacy?: 1 - Never What is the last grade level you completed in school?: 10th grade  Interpreter Needed?: No  Information entered by :: NAllen LPN  Past Medical History:  Diagnosis Date  . Arthritis   . Hyperlipidemia   . Hypertension   . OSA on CPAP 2019   Past Surgical History:  Procedure Laterality Date  . ABDOMINAL HYSTERECTOMY  2000  . COLONOSCOPY    . COLONOSCOPY WITH PROPOFOL N/A 07/06/2017   Procedure: COLONOSCOPY WITH PROPOFOL;  Surgeon: Robert Bellow, MD;  Location: [facility] ENDOSCOPY;  Service: Endoscopy;  Laterality: N/A;  . KNEE SURGERY Left 11/2017  . KNEE SURGERY Right   . UPPER GI ENDOSCOPY     Family History  Problem Relation Age of Onset  . Lung cancer Mother 20  . Diabetes Father   . COPD Father   . Arthritis Father   . Colon cancer Brother 58  . Arthritis Brother   . Diabetes Brother   . Early death Brother   . Hearing loss Brother   . Heart disease Brother   . Hyperlipidemia Brother   . Hypertension Brother   . Throat cancer Maternal Grandmother    Social History   Socioeconomic History  . Marital status: Divorced    Spouse name: Not on file  . Number of children: Not on file  . Years of education: Not on file  . Highest education level: Not on file  Occupational History  . Occupation: retired  Scientific laboratory technician  . Financial resource strain: Somewhat hard  . Food insecurity    Worry: Never true  Inability: Never true  . Transportation needs    Medical: No    Non-medical: No  Tobacco Use  . Smoking status: Former Smoker    Types: Cigarettes    Quit date: 09/07/1978    Years since quitting: 40.6  . Smokeless tobacco: Never Used  Substance and Sexual Activity  . Alcohol use: Yes    Comment: occasional  . Drug use: No  . Sexual activity: Not Currently  Lifestyle  . Physical activity    Days per week: 0 days    Minutes per session: 0 min  . Stress: Only a little  Relationships  . Social Herbalist on phone: Not on file    Gets together: Not on file    Attends religious service: Not on file    Active  member of club or organization: Not on file    Attends meetings of clubs or organizations: Not on file    Relationship status: Not on file  Other Topics Concern  . Not on file  Social History Narrative  . Not on file    Outpatient Encounter Medications as of 05/01/2019  Medication Sig  . acetaminophen (TYLENOL) 500 MG tablet Take 1,000 mg by mouth daily. Taking as needed  . cetirizine (ZYRTEC) 10 MG tablet Take by mouth.  . citalopram (CELEXA) 20 MG tablet Take 1 tablet (20 mg total) by mouth daily.  . fluticasone (FLONASE) 50 MCG/ACT nasal spray Place 2 sprays into both nostrils daily.  Marland Kitchen lisinopril-hydrochlorothiazide (PRINZIDE,ZESTORETIC) 20-25 MG tablet Take 1 tablet by mouth daily.  . montelukast (SINGULAIR) 10 MG tablet Take 1 tablet (10 mg total) by mouth at bedtime.  . Multiple Vitamins-Minerals (HAIR SKIN AND NAILS FORMULA PO) Take by mouth.  . celecoxib (CELEBREX) 200 MG capsule Take 1 capsule (200 mg total) by mouth daily. (Patient not taking: Reported on 05/01/2019)   No facility-administered encounter medications on file as of 05/01/2019.     Activities of Daily Living In your present state of health, do you have any difficulty performing the following activities: 05/01/2019  Hearing? N  Vision? N  Difficulty concentrating or making decisions? Y  Comment forgetfulness  Walking or climbing stairs? Y  Comment depends on how many  Dressing or bathing? N  Doing errands, shopping? N  Preparing Food and eating ? N  Using the Toilet? N  In the past six months, have you accidently leaked urine? Y  Comment has a weak bladder, hysterectomy  Do you have problems with loss of bowel control? N  Managing your Medications? N  Managing your Finances? N  Housekeeping or managing your Housekeeping? N  Some recent data might be hidden    Patient Care Team: Elby Beck, FNP as PCP - General (Nurse Practitioner) Robert Bellow, MD as Consulting Physician (General Surgery)     Assessment:   This is a routine wellness examination for Christina Meadows.  Exercise Activities and Dietary recommendations Current Exercise Habits: The patient does not participate in regular exercise at present(lifts a baby several times a day)  Goals    . Patient Stated     05/01/2019, wants to increase physical activity       Fall Risk Fall Risk  05/01/2019 11/10/2017  Falls in the past year? 0 No  Risk for fall due to : Medication side effect -  Follow up Falls evaluation completed;Falls prevention discussed -   Is the patient's home free of loose throw rugs in walkways, pet beds, electrical  cords, etc?   yes      Grab bars in the bathroom? yes      Handrails on the stairs?   yes      Adequate lighting?   yes  Timed Get Up and Go performed: n/a  Depression Screen PHQ 2/9 Scores 05/01/2019 11/10/2017 11/10/2017  PHQ - 2 Score 0 0 0  PHQ- 9 Score 0 - -     Cognitive Function MMSE - Mini Mental State Exam 05/01/2019  Orientation to time 5  Orientation to Place 5  Registration 3  Attention/ Calculation 5  Recall 3  Language- name 2 objects 0  Language- repeat 0  Language- follow 3 step command 0  Language- read & follow direction 0  Write a sentence 0  Copy design 0  Total score 21   Mini Cog  Mini-Cog screen was completed. Maximum score is 22. A value of 0 denotes this part of the MMSE was not completed or the patient failed this part of the Mini-Cog screening.       Immunization History  Administered Date(s) Administered  . Influenza, High Dose Seasonal PF 06/03/2018  . Pneumococcal Conjugate-13 11/10/2017    Qualifies for Shingles Vaccine? yes  Screening Tests Health Maintenance  Topic Date Due  . Hepatitis C Screening  12/19/1950  . TETANUS/TDAP  12/08/1970  . DEXA SCAN  12/07/2016  . PNA vac Low Risk Adult (2 of 2 - PPSV23) 11/11/2018  . INFLUENZA VACCINE  04/08/2019  . MAMMOGRAM  02/16/2021  . COLONOSCOPY  07/07/2027    Cancer Screenings: Lung: Low  Dose CT Chest recommended if Age 84-80 years, 30 pack-year currently smoking OR have quit w/in 15years. Patient does not qualify. Breast:  Up to date on Mammogram? Yes   Up to date of Bone Density/Dexa? No Colorectal: up to date  Additional Screenings: : Hepatitis C Screening: due     Plan:    Patient wants to increase physical activity.   I have personally reviewed and noted the following in the patient's chart:   . Medical and social history . Use of alcohol, tobacco or illicit drugs  . Current medications and supplements . Functional ability and status . Nutritional status . Physical activity . Advanced directives . List of other physicians . Hospitalizations, surgeries, and ER visits in previous 12 months . Vitals . Screenings to include cognitive, depression, and falls . Referrals and appointments  In addition, I have reviewed and discussed with patient certain preventive protocols, quality metrics, and best practice recommendations. A written personalized care plan for preventive services as well as general preventive health recommendations were provided to patient.     Kellie Simmering, LPN  [id number]

## 2019-05-01 NOTE — Patient Instructions (Signed)
Christina Meadows , Thank you for taking time to come for your Medicare Wellness Visit. I appreciate your ongoing commitment to your health goals. Please review the following plan we discussed and let me know if I can assist you in the future.   Screening recommendations/referrals: Colonoscopy: 06/2017 Mammogram: 02/2019 Bone Density: due Recommended yearly ophthalmology/optometry visit for glaucoma screening and checkup Recommended yearly dental visit for hygiene and checkup  Vaccinations: Influenza vaccine: 05/2018 Pneumococcal vaccine: PPSV23 due Tdap vaccine: due Shingles vaccine: discussed    Advanced directives: Advance directive discussed with you today.   Conditions/risks identified: obesity  Next appointment: 05/03/2019 at 2:30   Preventive Care 67 Years and Older, Female Preventive care refers to lifestyle choices and visits with your health care provider that can promote health and wellness. What does preventive care include?  A yearly physical exam. This is also called an annual well check.  Dental exams once or twice a year.  Routine eye exams. Ask your health care provider how often you should have your eyes checked.  Personal lifestyle choices, including:  Daily care of your teeth and gums.  Regular physical activity.  Eating a healthy diet.  Avoiding tobacco and drug use.  Limiting alcohol use.  Practicing safe sex.  Taking low-dose aspirin every day.  Taking vitamin and mineral supplements as recommended by your health care provider. What happens during an annual well check? The services and screenings done by your health care provider during your annual well check will depend on your age, overall health, lifestyle risk factors, and family history of disease. Counseling  Your health care provider may ask you questions about your:  Alcohol use.  Tobacco use.  Drug use.  Emotional well-being.  Home and relationship well-being.  Sexual activity.   Eating habits.  History of falls.  Memory and ability to understand (cognition).  Work and work Statistician.  Reproductive health. Screening  You may have the following tests or measurements:  Height, weight, and BMI.  Blood pressure.  Lipid and cholesterol levels. These may be checked every 5 years, or more frequently if you are over 5 years old.  Skin check.  Lung cancer screening. You may have this screening every year starting at age 61 if you have a 30-pack-year history of smoking and currently smoke or have quit within the past 15 years.  Fecal occult blood test (FOBT) of the stool. You may have this test every year starting at age 51.  Flexible sigmoidoscopy or colonoscopy. You may have a sigmoidoscopy every 5 years or a colonoscopy every 10 years starting at age 78.  Hepatitis C blood test.  Hepatitis B blood test.  Sexually transmitted disease (STD) testing.  Diabetes screening. This is done by checking your blood sugar (glucose) after you have not eaten for a while (fasting). You may have this done every 1-3 years.  Bone density scan. This is done to screen for osteoporosis. You may have this done starting at age 47.  Mammogram. This may be done every 1-2 years. Talk to your health care provider about how often you should have regular mammograms. Talk with your health care provider about your test results, treatment options, and if necessary, the need for more tests. Vaccines  Your health care provider may recommend certain vaccines, such as:  Influenza vaccine. This is recommended every year.  Tetanus, diphtheria, and acellular pertussis (Tdap, Td) vaccine. You may need a Td booster every 10 years.  Zoster vaccine. You may need this after  age 77.  Pneumococcal 13-valent conjugate (PCV13) vaccine. One dose is recommended after age 59.  Pneumococcal polysaccharide (PPSV23) vaccine. One dose is recommended after age 44. Talk to your health care provider  about which screenings and vaccines you need and how often you need them. This information is not intended to replace advice given to you by your health care provider. Make sure you discuss any questions you have with your health care provider. Document Released: 09/20/2015 Document Revised: 05/13/2016 Document Reviewed: 06/25/2015 Elsevier Interactive Patient Education  2017 [place] Prevention in the Home Falls can cause injuries. They can happen to people of all ages. There are many things you can do to make your home safe and to help prevent falls. What can I do on the outside of my home?  Regularly fix the edges of walkways and driveways and fix any cracks.  Remove anything that might make you trip as you walk through a door, such as a raised step or threshold.  Trim any bushes or trees on the path to your home.  Use bright outdoor lighting.  Clear any walking paths of anything that might make someone trip, such as rocks or tools.  Regularly check to see if handrails are loose or broken. Make sure that both sides of any steps have handrails.  Any raised decks and porches should have guardrails on the edges.  Have any leaves, snow, or ice cleared regularly.  Use sand or salt on walking paths during winter.  Clean up any spills in your garage right away. This includes oil or grease spills. What can I do in the bathroom?  Use night lights.  Install grab bars by the toilet and in the tub and shower. Do not use towel bars as grab bars.  Use non-skid mats or decals in the tub or shower.  If you need to sit down in the shower, use a plastic, non-slip stool.  Keep the floor dry. Clean up any water that spills on the floor as soon as it happens.  Remove soap buildup in the tub or shower regularly.  Attach bath mats securely with double-sided non-slip rug tape.  Do not have throw rugs and other things on the floor that can make you trip. What can I do in the  bedroom?  Use night lights.  Make sure that you have a light by your bed that is easy to reach.  Do not use any sheets or blankets that are too big for your bed. They should not hang down onto the floor.  Have a firm chair that has side arms. You can use this for support while you get dressed.  Do not have throw rugs and other things on the floor that can make you trip. What can I do in the kitchen?  Clean up any spills right away.  Avoid walking on wet floors.  Keep items that you use a lot in easy-to-reach places.  If you need to reach something above you, use a strong step stool that has a grab bar.  Keep electrical cords out of the way.  Do not use floor polish or wax that makes floors slippery. If you must use wax, use non-skid floor wax.  Do not have throw rugs and other things on the floor that can make you trip. What can I do with my stairs?  Do not leave any items on the stairs.  Make sure that there are handrails on both sides of the stairs  and use them. Fix handrails that are broken or loose. Make sure that handrails are as long as the stairways.  Check any carpeting to make sure that it is firmly attached to the stairs. Fix any carpet that is loose or worn.  Avoid having throw rugs at the top or bottom of the stairs. If you do have throw rugs, attach them to the floor with carpet tape.  Make sure that you have a light switch at the top of the stairs and the bottom of the stairs. If you do not have them, ask someone to add them for you. What else can I do to help prevent falls?  Wear shoes that:  Do not have high heels.  Have rubber bottoms.  Are comfortable and fit you well.  Are closed at the toe. Do not wear sandals.  If you use a stepladder:  Make sure that it is fully opened. Do not climb a closed stepladder.  Make sure that both sides of the stepladder are locked into place.  Ask someone to hold it for you, if possible.  Clearly mark and make  sure that you can see:  Any grab bars or handrails.  First and last steps.  Where the edge of each step is.  Use tools that help you move around (mobility aids) if they are needed. These include:  Canes.  Walkers.  Scooters.  Crutches.  Turn on the lights when you go into a dark area. Replace any light bulbs as soon as they burn out.  Set up your furniture so you have a clear path. Avoid moving your furniture around.  If any of your floors are uneven, fix them.  If there are any pets around you, be aware of where they are.  Review your medicines with your doctor. Some medicines can make you feel dizzy. This can increase your chance of falling. Ask your doctor what other things that you can do to help prevent falls. This information is not intended to replace advice given to you by your health care provider. Make sure you discuss any questions you have with your health care provider. Document Released: 06/20/2009 Document Revised: 01/30/2016 Document Reviewed: 09/28/2014 Elsevier Interactive Patient Education  2017 [employer].

## 2019-05-01 NOTE — Progress Notes (Signed)
PCP notes:  Health Maintenance:  Hep C screening, tetanus, DEXA and PPSV23 are due.  Abnormal Screenings:  None  Patient concerns:  None  Nurse concerns:  None  Next PCP appt.: 05/03/2019 at 2:30

## 2019-05-03 ENCOUNTER — Ambulatory Visit (INDEPENDENT_AMBULATORY_CARE_PROVIDER_SITE_OTHER): Payer: Medicare Other | Admitting: Family Medicine

## 2019-05-03 ENCOUNTER — Encounter: Payer: Self-pay | Admitting: Family Medicine

## 2019-05-03 ENCOUNTER — Other Ambulatory Visit: Payer: Self-pay

## 2019-05-03 VITALS — BP 118/68 | HR 71 | Temp 98.2°F | Ht 59.0 in | Wt 187.8 lb

## 2019-05-03 DIAGNOSIS — E785 Hyperlipidemia, unspecified: Secondary | ICD-10-CM

## 2019-05-03 DIAGNOSIS — E6609 Other obesity due to excess calories: Secondary | ICD-10-CM

## 2019-05-03 DIAGNOSIS — Z6837 Body mass index (BMI) 37.0-37.9, adult: Secondary | ICD-10-CM | POA: Diagnosis not present

## 2019-05-03 DIAGNOSIS — G4733 Obstructive sleep apnea (adult) (pediatric): Secondary | ICD-10-CM | POA: Diagnosis not present

## 2019-05-03 DIAGNOSIS — I10 Essential (primary) hypertension: Secondary | ICD-10-CM | POA: Diagnosis not present

## 2019-05-03 DIAGNOSIS — Z23 Encounter for immunization: Secondary | ICD-10-CM

## 2019-05-03 DIAGNOSIS — Z01419 Encounter for gynecological examination (general) (routine) without abnormal findings: Secondary | ICD-10-CM

## 2019-05-03 DIAGNOSIS — Z9989 Dependence on other enabling machines and devices: Secondary | ICD-10-CM | POA: Diagnosis not present

## 2019-05-03 DIAGNOSIS — E66812 Obesity, class 2: Secondary | ICD-10-CM

## 2019-05-03 NOTE — Progress Notes (Signed)
Subjective:    Patient ID: Christina Meadows, female    DOB: 1951/09/05, 68 y.o.   MRN: [id number]  HPI This is a 68 yo female who presents today for Well Woman exam.  She had her Medicare annual wellness visit 05/01/2019.  She has been doing well.  She is currently living with her ex-husband, daughter, 3 grandchildren and families for dogs.  She has purchased a camper to put in her daughter's backyard so that she can have more privacy and space to herself.  She helps with the grandchildren as well as with a 7-1/2-year-old great granddaughter.   Last CPE-12/01/2017 Mammo-02/17/2019 Pap-history of hysterectomy Colonoscopy-07/06/2017 Tdap-deferred due to not covered by insurance Flu- reguarl Eye- regular Dental- regular Exercise-housework, walking, yard work  [name] had gained more weight but has been working on losing it by following a low-carb, keto type diet.  Increased stress- feels like she is handling appropriately and is looking forward to having her own space.  OSA on CPAP- doing well.  She went out of town and did not use her CPAP for a couple of nights and noticed increased headaches and fatigue.  These resolved when she returned home was able to use her CPAP.  Past Medical History:  Diagnosis Date  . Arthritis   . Hyperlipidemia   . Hypertension   . OSA on CPAP 2019   Past Surgical History:  Procedure Laterality Date  . ABDOMINAL HYSTERECTOMY  2000  . COLONOSCOPY    . COLONOSCOPY WITH PROPOFOL N/A 07/06/2017   Procedure: COLONOSCOPY WITH PROPOFOL;  Surgeon: Christina Bellow, MD;  Location: [facility] ENDOSCOPY;  Service: Endoscopy;  Laterality: N/A;  . KNEE SURGERY Left 11/2017  . KNEE SURGERY Right 02/2018  . UPPER GI ENDOSCOPY     Family History  Problem Relation Age of Onset  . Lung cancer Mother 15  . Diabetes Father   . COPD Father   . Arthritis Father   . Colon cancer Brother 60  . Arthritis Brother   . Diabetes Brother   . Early death Brother   .  Hearing loss Brother   . Heart disease Brother   . Hyperlipidemia Brother   . Hypertension Brother   . Throat cancer Maternal Grandmother    Social History   Tobacco Use  . Smoking status: Former Smoker    Types: Cigarettes    Quit date: 09/07/1978    Years since quitting: 40.6  . Smokeless tobacco: Never Used  Substance Use Topics  . Alcohol use: Yes    Comment: occasional  . Drug use: No      Review of Systems  Constitutional: Negative.   HENT: Negative.   Eyes: Negative.   Respiratory: Negative.   Cardiovascular: Negative.   Gastrointestinal: Negative.   Endocrine: Negative.   Genitourinary: Negative.   Musculoskeletal:       Bilateral knee replacements.  Occasional swelling and stiffness of the left.  Skin: Negative.   Allergic/Immunologic: Negative.   Neurological: Negative.   Hematological: Negative.   Psychiatric/Behavioral: Negative.        Objective:   Physical Exam Vitals signs reviewed.  Constitutional:      General: She is not in acute distress.    Appearance: Normal appearance. She is obese. She is not ill-appearing, toxic-appearing or diaphoretic.  HENT:     Head: Normocephalic and atraumatic.  Eyes:     Conjunctiva/sclera: Conjunctivae normal.  Neck:     Musculoskeletal: Normal range of motion and neck supple.  Cardiovascular:     Rate and Rhythm: Normal rate and regular rhythm.     Heart sounds: Normal heart sounds.  Pulmonary:     Effort: Pulmonary effort is normal.     Breath sounds: Normal breath sounds.  Abdominal:     General: Abdomen is flat. Bowel sounds are normal. There is no distension.     Palpations: Abdomen is soft. There is no mass.     Tenderness: There is no abdominal tenderness. There is no guarding or rebound.     Hernia: No hernia is present.  Musculoskeletal:        General: No swelling or tenderness.     Right lower leg: No edema.     Left lower leg: No edema.  Skin:    General: Skin is warm and dry.   Neurological:     Mental Status: She is alert and oriented to person, place, and time.  Psychiatric:        Mood and Affect: Mood normal.        Behavior: Behavior normal.        Thought Content: Thought content normal.        Judgment: Judgment normal.       BP 118/68 (BP Location: Right Arm, Patient Position: Sitting, Cuff Size: Normal)   Pulse 71   Temp 98.2 F (36.8 C) (Temporal)   Ht 4\' 11"  (1.499 m)   Wt 187 lb 12.8 oz (85.2 kg)   SpO2 97%   BMI 37.93 kg/m  Wt Readings from Last 3 Encounters:  05/03/19 187 lb 12.8 oz (85.2 kg)  05/01/19 189 lb (85.7 kg)  09/13/18 171 lb 12.8 oz (77.9 kg)   Depression screen Christina Meadows 2/9 05/01/2019 11/10/2017 11/10/2017  Decreased Interest 0 0 0  Down, Depressed, Hopeless 0 0 0  PHQ - 2 Score 0 0 0  Altered sleeping 0 - -  Tired, decreased energy 0 - -  Change in appetite 0 - -  Feeling bad or failure about yourself  0 - -  Trouble concentrating 0 - -  Moving slowly or fidgety/restless 0 - -  Suicidal thoughts 0 - -  PHQ-9 Score 0 - -       Assessment & Plan:  1. Well woman exam - Discussed and encouraged healthy lifestyle choices- adequate sleep, regular exercise, stress management and healthy food choices.   2. Need for 23-polyvalent pneumococcal polysaccharide vaccine - Pneumococcal polysaccharide vaccine 23-valent greater than or equal to 2yo subcutaneous/IM  3. Essential hypertension - well controlled on current meds without side effects  4. OSA on CPAP - tolerating well with good improvement of sympotms  5. Class 2 obesity due to excess calories without serious comorbidity with body mass index (BMI) of 37.0 to 37.9 in adult - encouraged her to continue to watch food choices, portions, increase low calorie, high fiber food  6. Hyperlipidemia, unspecified hyperlipidemia type - ASCVD risk 7% Discussed starting medication but patient wants to work on diet - follow up in 6 months for recheck.    Clarene Reamer, FNP-BC   [facility]  05/04/2019 7:53 AM

## 2019-05-03 NOTE — Patient Instructions (Signed)
Please follow up in 6 months to recheck your cholesterol    Health Maintenance After Age 67 After age 50, you are at a higher risk for certain long-term diseases and infections as well as injuries from falls. Falls are a major cause of broken bones and head injuries in people who are older than age 13. Getting regular preventive care can help to keep you healthy and well. Preventive care includes getting regular testing and making lifestyle changes as recommended by your health care provider. Talk with your health care provider about:  Which screenings and tests you should have. A screening is a test that checks for a disease when you have no symptoms.  A diet and exercise plan that is right for you. What should I know about screenings and tests to prevent falls? Screening and testing are the best ways to find a health problem early. Early diagnosis and treatment give you the best chance of managing medical conditions that are common after age 4. Certain conditions and lifestyle choices may make you more likely to have a fall. Your health care provider may recommend:  Regular vision checks. Poor vision and conditions such as cataracts can make you more likely to have a fall. If you wear glasses, make sure to get your prescription updated if your vision changes.  Medicine review. Work with your health care provider to regularly review all of the medicines you are taking, including over-the-counter medicines. Ask your health care provider about any side effects that may make you more likely to have a fall. Tell your health care provider if any medicines that you take make you feel dizzy or sleepy.  Osteoporosis screening. Osteoporosis is a condition that causes the bones to get weaker. This can make the bones weak and cause them to break more easily.  Blood pressure screening. Blood pressure changes and medicines to control blood pressure can make you feel dizzy.  Strength and balance checks.  Your health care provider may recommend certain tests to check your strength and balance while standing, walking, or changing positions.  Foot health exam. Foot pain and numbness, as well as not wearing proper footwear, can make you more likely to have a fall.  Depression screening. You may be more likely to have a fall if you have a fear of falling, feel emotionally low, or feel unable to do activities that you used to do.  Alcohol use screening. Using too much alcohol can affect your balance and may make you more likely to have a fall. What actions can I take to lower my risk of falls? General instructions  Talk with your health care provider about your risks for falling. Tell your health care provider if: ? You fall. Be sure to tell your health care provider about all falls, even ones that seem minor. ? You feel dizzy, sleepy, or off-balance.  Take over-the-counter and prescription medicines only as told by your health care provider. These include any supplements.  Eat a healthy diet and maintain a healthy weight. A healthy diet includes low-fat dairy products, low-fat (lean) meats, and fiber from whole grains, beans, and lots of fruits and vegetables. Home safety  Remove any tripping hazards, such as rugs, cords, and clutter.  Install safety equipment such as grab bars in bathrooms and safety rails on stairs.  Keep rooms and walkways well-lit. Activity   Follow a regular exercise program to stay fit. This will help you maintain your balance. Ask your health care provider what types  of exercise are appropriate for you.  If you need a cane or walker, use it as recommended by your health care provider.  Wear supportive shoes that have nonskid soles. Lifestyle  Do not drink alcohol if your health care provider tells you not to drink.  If you drink alcohol, limit how much you have: ? 0-1 drink a day for women. ? 0-2 drinks a day for men.  Be aware of how much alcohol is in your  drink. In the [place] one drink equals one typical bottle of beer (12 oz), one-half glass of wine (5 oz), or one shot of hard liquor (1 oz).  Do not use any products that contain nicotine or tobacco, such as cigarettes and e-cigarettes. If you need help quitting, ask your health care provider. Summary  Having a healthy lifestyle and getting preventive care can help to protect your health and wellness after age 66.  Screening and testing are the best way to find a health problem early and help you avoid having a fall. Early diagnosis and treatment give you the best chance for managing medical conditions that are more common for people who are older than age 65.  Falls are a major cause of broken bones and head injuries in people who are older than age 50. Take precautions to prevent a fall at home.  Work with your health care provider to learn what changes you can make to improve your health and wellness and to prevent falls. This information is not intended to replace advice given to you by your health care provider. Make sure you discuss any questions you have with your health care provider. Document Released: 07/07/2017 Document Revised: 12/15/2018 Document Reviewed: 07/07/2017 Elsevier Patient Education  2020 [employer].

## 2019-05-04 DIAGNOSIS — G4733 Obstructive sleep apnea (adult) (pediatric): Secondary | ICD-10-CM | POA: Insufficient documentation

## 2019-05-08 ENCOUNTER — Other Ambulatory Visit: Payer: Self-pay | Admitting: Family Medicine

## 2019-05-08 DIAGNOSIS — M810 Age-related osteoporosis without current pathological fracture: Secondary | ICD-10-CM

## 2019-05-08 DIAGNOSIS — E2839 Other primary ovarian failure: Secondary | ICD-10-CM

## 2019-05-08 DIAGNOSIS — Z13820 Encounter for screening for osteoporosis: Secondary | ICD-10-CM

## 2019-05-18 ENCOUNTER — Ambulatory Visit (INDEPENDENT_AMBULATORY_CARE_PROVIDER_SITE_OTHER): Payer: Medicare Other

## 2019-05-18 DIAGNOSIS — Z23 Encounter for immunization: Secondary | ICD-10-CM

## 2019-05-22 ENCOUNTER — Ambulatory Visit
Admission: RE | Admit: 2019-05-22 | Discharge: 2019-05-22 | Disposition: A | Discharge status: Home / Self Care | Payer: Medicare Other | Source: Ambulatory Visit | Attending: Family Medicine | Admitting: Family Medicine

## 2019-05-22 DIAGNOSIS — Z13820 Encounter for screening for osteoporosis: Secondary | ICD-10-CM | POA: Insufficient documentation

## 2019-05-22 DIAGNOSIS — Z780 Asymptomatic menopausal state: Secondary | ICD-10-CM | POA: Diagnosis not present

## 2019-05-22 DIAGNOSIS — M810 Age-related osteoporosis without current pathological fracture: Secondary | ICD-10-CM | POA: Diagnosis not present

## 2019-05-22 DIAGNOSIS — E2839 Other primary ovarian failure: Secondary | ICD-10-CM | POA: Diagnosis not present

## 2019-05-22 DIAGNOSIS — M8588 Other specified disorders of bone density and structure, other site: Secondary | ICD-10-CM | POA: Diagnosis not present

## 2019-05-24 DIAGNOSIS — M810 Age-related osteoporosis without current pathological fracture: Secondary | ICD-10-CM | POA: Insufficient documentation

## 2019-05-24 NOTE — Addendum Note (Signed)
Addended by: Clarene Reamer B on: 05/24/2019 05:50 PM   Modules accepted: Orders

## 2019-05-29 ENCOUNTER — Other Ambulatory Visit (INDEPENDENT_AMBULATORY_CARE_PROVIDER_SITE_OTHER): Payer: Medicare Other

## 2019-05-29 DIAGNOSIS — E2839 Other primary ovarian failure: Secondary | ICD-10-CM | POA: Diagnosis not present

## 2019-05-29 DIAGNOSIS — M810 Age-related osteoporosis without current pathological fracture: Secondary | ICD-10-CM

## 2019-05-30 LAB — VITAMIN D 25 HYDROXY (VIT D DEFICIENCY, FRACTURES): VITD: 25.05 ng/mL — ABNORMAL LOW (ref 30.00–100.00)

## 2019-06-06 ENCOUNTER — Telehealth: Payer: Self-pay | Admitting: Family Medicine

## 2019-06-06 NOTE — Telephone Encounter (Signed)
Patient returned call in regards to her lab results  

## 2019-06-07 NOTE — Telephone Encounter (Signed)
Message left for patient to return my call.  See result note

## 2019-06-08 NOTE — Telephone Encounter (Signed)
Patient was advised of results by Ashtyn today 06/08/2019, see lab result note

## 2019-07-31 ENCOUNTER — Other Ambulatory Visit: Payer: Self-pay | Admitting: Family Medicine

## 2019-07-31 ENCOUNTER — Telehealth: Payer: Self-pay | Admitting: Family Medicine

## 2019-07-31 MED ORDER — DICLOFENAC SODIUM 75 MG PO TBEC: 75.0000 mg | DELAYED_RELEASE_TABLET | Freq: Two times a day (BID) | ORAL | 0 refills | Status: DC | PRN

## 2019-07-31 NOTE — Telephone Encounter (Signed)
Please call patient and tell her that I have sent in a refill of her diclofenac, I was able to see note from orthopedic surgeon. Remind her to take as little as possible due to effects on stomach and kidneys.

## 2019-07-31 NOTE — Telephone Encounter (Signed)
Next Appt With Family Medicine Elby Beck, [place]) 11/01/2019 at 3:30 PM  Not filled by our office.   Please advise, thanks.

## 2019-07-31 NOTE — Telephone Encounter (Signed)
Pt calling checking refill rx Diclofenac  [facility] [address]  Pt is out of her meds

## 2019-08-02 ENCOUNTER — Other Ambulatory Visit: Payer: Self-pay

## 2019-08-02 NOTE — Telephone Encounter (Signed)
Christina Meadows was unavailable when patient returned her call.  I let patient know [name]'s comments.  Patient said she only takes 1 per day.  Patient wants to make sure [name] will refill medication when it runs out, since patient has been released from the orthopaedic surgeon.  Patient said taking the 1 pill really helps.

## 2019-08-02 NOTE — Telephone Encounter (Signed)
Noted  

## 2019-09-28 ENCOUNTER — Ambulatory Visit: Payer: Medicare Other | Admitting: Internal Medicine

## 2019-09-29 ENCOUNTER — Ambulatory Visit: Payer: Medicare Other | Attending: Internal Medicine

## 2019-09-29 DIAGNOSIS — Z20822 Contact with and (suspected) exposure to covid-19: Secondary | ICD-10-CM | POA: Diagnosis not present

## 2019-09-30 LAB — NOVEL CORONAVIRUS, NAA: SARS-CoV-2, NAA: NOT DETECTED

## 2019-10-01 ENCOUNTER — Telehealth: Payer: Self-pay | Admitting: Family Medicine

## 2019-10-01 NOTE — Telephone Encounter (Signed)
Pt is aware covid 19 test is neg on 10/01/2019

## 2019-10-10 ENCOUNTER — Other Ambulatory Visit: Payer: Self-pay

## 2019-10-10 ENCOUNTER — Ambulatory Visit: Payer: Medicare Other | Admitting: Pulmonary Disease

## 2019-10-10 ENCOUNTER — Ambulatory Visit (INDEPENDENT_AMBULATORY_CARE_PROVIDER_SITE_OTHER): Payer: Medicare Other | Admitting: Pulmonary Disease

## 2019-10-10 ENCOUNTER — Encounter: Payer: Self-pay | Admitting: Pulmonary Disease

## 2019-10-10 VITALS — BP 122/74 | HR 64 | Temp 97.5°F | Ht 59.0 in | Wt 193.8 lb

## 2019-10-10 DIAGNOSIS — G4733 Obstructive sleep apnea (adult) (pediatric): Secondary | ICD-10-CM

## 2019-10-10 DIAGNOSIS — Z9989 Dependence on other enabling machines and devices: Secondary | ICD-10-CM | POA: Diagnosis not present

## 2019-10-10 NOTE — Progress Notes (Signed)
Reviewed and agree with assessment/plan.   Anthem Frazer, MD [facility] 09/02/2016, 12:24 PM Pager:  [phone]  

## 2019-10-10 NOTE — Assessment & Plan Note (Signed)
Plan: Continue CPAP We will refill supplies Start nasal saline rinses and use nasal saline gel to help with nasal dryness Follow-up with our office in 1 year

## 2019-10-10 NOTE — Patient Instructions (Addendum)
You were seen today by Lauraine Rinne, NP  for:   1. OSA on CPAP  We will refill your CPAP supplies We will send an order to your DME company to see if they can adjust your humidity I would start nasal saline rinses twice daily to see if that helps with moisturizing her nasal passages Can also do nasal saline gel-AYR brand, this is over the counter  We recommend that you continue using your CPAP daily >>>Keep up the hard work using your device >>> Goal should be wearing this for the entire night that you are sleeping, at least 4 to 6 hours  Remember:  . Do not drive or operate heavy machinery if tired or drowsy.  . Please notify the supply company and office if you are unable to use your device regularly due to missing supplies or machine being broken.  . Work on maintaining a healthy weight and following your recommended nutrition plan  . Maintain proper daily exercise and movement  . Maintaining proper use of your device can also help improve management of other chronic illnesses such as: Blood pressure, blood sugars, and weight management.   BiPAP/ CPAP Cleaning:  >>>Clean weekly, with Dawn soap, and bottle brush.  Set up to air dry. >>> Wipe mask out daily with wet wipe or towelette      Follow Up:    Return in about 1 year (around 10/09/2020), or if symptoms worsen or fail to improve, for [facility].   Please do your part to reduce the spread of COVID-19:      Reduce your risk of any infection  and COVID19 by using the similar precautions used for avoiding the common cold or flu:  Marland Kitchen Wash your hands often with soap and warm water for at least 20 seconds.  If soap and water are not readily available, use an alcohol-based hand sanitizer with at least 60% alcohol.  . If coughing or sneezing, cover your mouth and nose by coughing or sneezing into the elbow areas of your shirt or coat, into a tissue or into your sleeve (not your hands). Langley Gauss A MASK when in  public  . Avoid shaking hands with others and consider head nods or verbal greetings only. . Avoid touching your eyes, nose, or mouth with unwashed hands.  . Avoid close contact with people who are sick. . Avoid places or events with large numbers of people in one location, like concerts or sporting events. . If you have some symptoms but not all symptoms, continue to monitor at home and seek medical attention if your symptoms worsen. . If you are having a medical emergency, call 911.   [place] / e-Visit: [email or web address]         [facility]: [facility]: [id number]                   [facility]: [id number]     It is flu season:   >>> Best ways to protect herself from the flu: Receive the yearly flu vaccine, practice good hand hygiene washing with soap and also using hand sanitizer when available, eat a nutritious meals, get adequate rest, hydrate appropriately   Please contact the office if your symptoms worsen or you have concerns that you are not improving.   Thank you for choosing [facility] for your healthcare, and for allowing [place] to  partner with you on your healthcare journey. I am thankful to be able to provide care to you today.   Wyn Quaker FNP-C

## 2019-10-10 NOTE — Progress Notes (Signed)
@Patient  ID: Christina Meadows, female    DOB: 09/02/1951, 69 y.o.   MRN: [id number]  Chief Complaint  Patient presents with  . Follow-up    Pt states she has been doing good since last ov. Has had a dry irritated nose. If she doesnt wear her machine she notices that she has more SOB on exertion. Pt denies any cough, wheezing, feer, or chills.    Referring provider: Elby Beck, FNP  HPI:  69 year old female former smoker followed in our office for obstructive sleep apnea  PMH: Anxiety, hypertension, palpitations Smoker/ Smoking History: Former smoker.  Quit 1980. Maintenance: None Pt of: [facility]  10/10/2019  - Visit   69 year old female former smoker followed in our office for obstructive sleep apnea.  She is doing quite well managed on CPAP.  CPAP compliance report shows adequate compliance.  See compliance report listed below:  09/08/2019-10/07/2019-18 at the last 30 days use, all 18 of those days greater than 4 hours, average usage 10 hours and 8 minutes, APAP setting 8-15, AHI 1.8  Patient reports that she did struggle with compliance at the beginning of January due to dealing with a sinus infection.  This has since resolved.  Questionaires / Pulmonary Flowsheets:   Epworth:  Results of the Epworth flowsheet 06/15/2018  Sitting and reading 0  Watching TV 0  Sitting, inactive in a public place (e.g. a theatre or a meeting) 0  As a passenger in a car for an hour without a break 0  Lying down to rest in the afternoon when circumstances permit 0  Sitting and talking to someone 0  Sitting quietly after a lunch without alcohol 0  In a car, while stopped for a few minutes in traffic 0  Total score 0    Tests:   FENO:  No results found for: NITRICOXIDE  PFT: No flowsheet data found.  WALK:  No flowsheet data found.  Imaging: No results found.  Lab Results:  CBC    Component Value Date/Time   WBC 9.4 06/03/2018 1623   RBC 4.85 06/03/2018 1623    HGB 14.4 06/03/2018 1623   HCT 42.2 06/03/2018 1623   PLT 240 06/03/2018 1623   MCV 87.0 06/03/2018 1623   MCH 29.7 06/03/2018 1623   MCHC 34.1 06/03/2018 1623   RDW 13.1 06/03/2018 1623   LYMPHSABS 2,679 06/03/2018 1623   EOSABS 197 06/03/2018 1623   BASOSABS 56 06/03/2018 1623    BMET    Component Value Date/Time   NA 137 04/27/2019 0845   K 3.9 04/27/2019 0845   CL 97 04/27/2019 0845   CO2 32 04/27/2019 0845   GLUCOSE 93 04/27/2019 0845   BUN 14 04/27/2019 0845   CREATININE 0.74 04/27/2019 0845   CREATININE 0.60 06/03/2018 1623   CALCIUM 9.3 04/27/2019 0845    BNP No results found for: BNP  ProBNP No results found for: PROBNP  Specialty Problems      Pulmonary Problems   OSA on CPAP      No Known Allergies  Immunization History  Administered Date(s) Administered  . Fluad Quad(high Dose 65+) 05/18/2019  . Influenza, High Dose Seasonal PF 06/03/2018  . Pneumococcal Conjugate-13 11/10/2017  . Pneumococcal Polysaccharide-23 05/03/2019    Past Medical History:  Diagnosis Date  . Arthritis   . Hyperlipidemia   . Hypertension   . OSA on CPAP 2019    Tobacco History: Social History   Tobacco Use  Smoking Status Former Smoker  .  Types: Cigarettes  . Quit date: 09/07/1978  . Years since quitting: 41.1  Smokeless Tobacco Never Used   Counseling given: Yes   Continue to not smoke  Outpatient Encounter Medications as of 10/10/2019  Medication Sig  . acetaminophen (TYLENOL) 500 MG tablet Take 1,000 mg by mouth daily. Taking as needed  . cetirizine (ZYRTEC) 10 MG tablet Take by mouth.  . cholecalciferol (VITAMIN D3) 25 MCG (1000 UNIT) tablet Take 1,000 Units by mouth daily.  . citalopram (CELEXA) 20 MG tablet Take 1 tablet (20 mg total) by mouth daily.  . diclofenac (VOLTAREN) 75 MG EC tablet Take 1 tablet (75 mg total) by mouth 2 (two) times daily as needed. TAKE 1 TABLET BY MOUTH TWICE DAILY (IF USING THIS MEDICATION DO NOT USE CELEBREX OR OTHER ANTI  INFLAMMATORY)  . fluticasone (FLONASE) 50 MCG/ACT nasal spray Place 2 sprays into both nostrils daily.  Marland Kitchen lisinopril-hydrochlorothiazide (PRINZIDE,ZESTORETIC) 20-25 MG tablet Take 1 tablet by mouth daily.  . montelukast (SINGULAIR) 10 MG tablet Take 1 tablet (10 mg total) by mouth at bedtime.  . Multiple Vitamins-Minerals (HAIR SKIN AND NAILS FORMULA PO) Take by mouth.  Marland Kitchen UNABLE TO FIND Med Name: CPAP (pressure 11)   No facility-administered encounter medications on file as of 10/10/2019.     Review of Systems  Review of Systems  Constitutional: Negative for activity change, fatigue and fever.  HENT: Negative for sinus pressure, sinus pain and sore throat.   Respiratory: Negative for cough, shortness of breath and wheezing.   Cardiovascular: Negative for chest pain and palpitations.  Gastrointestinal: Negative for diarrhea, nausea and vomiting.  Musculoskeletal: Negative for arthralgias.  Neurological: Negative for dizziness.  Psychiatric/Behavioral: Negative for sleep disturbance. The patient is not nervous/anxious.      Physical Exam  BP 122/74 (BP Location: Left Arm, Patient Position: Sitting, Cuff Size: Large)   Pulse 64   Temp (!) 97.5 F (36.4 C) (Temporal)   Ht 4\' 11"  (1.499 m)   Wt 193 lb 12.8 oz (87.9 kg)   SpO2 97% Comment: on ra  BMI 39.14 kg/m   Wt Readings from Last 5 Encounters:  10/10/19 193 lb 12.8 oz (87.9 kg)  05/03/19 187 lb 12.8 oz (85.2 kg)  05/01/19 189 lb (85.7 kg)  09/13/18 171 lb 12.8 oz (77.9 kg)  06/15/18 176 lb (79.8 kg)    BMI Readings from Last 5 Encounters:  10/10/19 39.14 kg/m  05/03/19 37.93 kg/m  05/01/19 37.53 kg/m  09/13/18 34.70 kg/m  06/15/18 35.55 kg/m     Physical Exam Vitals and nursing note reviewed.  Constitutional:      General: She is not in acute distress.    Appearance: Normal appearance. She is obese.  HENT:     Head: Normocephalic and atraumatic.     Right Ear: External ear normal.     Left Ear: External  ear normal.     Nose: Nose normal.     Mouth/Throat:     Mouth: Mucous membranes are moist.     Pharynx: Oropharynx is clear.  Eyes:     Pupils: Pupils are equal, round, and reactive to light.  Cardiovascular:     Rate and Rhythm: Normal rate and regular rhythm.     Pulses: Normal pulses.     Heart sounds: Normal heart sounds. No murmur.  Pulmonary:     Effort: Pulmonary effort is normal. No respiratory distress.     Breath sounds: Normal breath sounds. No decreased air movement. No decreased breath  sounds, wheezing or rales.  Musculoskeletal:     Cervical back: Normal range of motion.  Skin:    General: Skin is warm and dry.     Capillary Refill: Capillary refill takes less than 2 seconds.  Neurological:     General: No focal deficit present.     Mental Status: She is alert and oriented to person, place, and time. Mental status is at baseline.     Gait: Gait normal.  Psychiatric:        Mood and Affect: Mood normal.        Behavior: Behavior normal.        Thought Content: Thought content normal.        Judgment: Judgment normal.       Assessment & Plan:   OSA on CPAP Plan: Continue CPAP We will refill supplies Start nasal saline rinses and use nasal saline gel to help with nasal dryness Follow-up with our office in 1 year    Return in about 1 year (around 10/09/2020), or if symptoms worsen or fail to improve, for [facility].   Lauraine Rinne, NP 10/10/2019   This appointment required 26 minutes of patient care (this includes precharting, chart review, review of results, face-to-face care, etc.).

## 2019-11-01 ENCOUNTER — Encounter: Payer: Self-pay | Admitting: Family Medicine

## 2019-11-01 ENCOUNTER — Other Ambulatory Visit: Payer: Self-pay

## 2019-11-01 ENCOUNTER — Ambulatory Visit (INDEPENDENT_AMBULATORY_CARE_PROVIDER_SITE_OTHER): Payer: Medicare Other | Admitting: Family Medicine

## 2019-11-01 VITALS — BP 124/72 | HR 82 | Temp 97.8°F | Ht 59.0 in | Wt 191.4 lb

## 2019-11-01 DIAGNOSIS — I10 Essential (primary) hypertension: Secondary | ICD-10-CM | POA: Diagnosis not present

## 2019-11-01 DIAGNOSIS — R1084 Generalized abdominal pain: Secondary | ICD-10-CM

## 2019-11-01 DIAGNOSIS — N3946 Mixed incontinence: Secondary | ICD-10-CM

## 2019-11-01 DIAGNOSIS — M79671 Pain in right foot: Secondary | ICD-10-CM

## 2019-11-01 DIAGNOSIS — J309 Allergic rhinitis, unspecified: Secondary | ICD-10-CM | POA: Diagnosis not present

## 2019-11-01 DIAGNOSIS — R300 Dysuria: Secondary | ICD-10-CM

## 2019-11-01 DIAGNOSIS — F339 Major depressive disorder, recurrent, unspecified: Secondary | ICD-10-CM | POA: Diagnosis not present

## 2019-11-01 LAB — POCT URINALYSIS DIPSTICK
Bilirubin, UA: NEGATIVE
Blood, UA: NEGATIVE
Glucose, UA: NEGATIVE
Ketones, UA: NEGATIVE
Leukocytes, UA: NEGATIVE
Nitrite, UA: NEGATIVE
Protein, UA: NEGATIVE
Spec Grav, UA: 1.015 (ref 1.010–1.025)
Urobilinogen, UA: 0.2 E.U./dL
pH, UA: 6 (ref 5.0–8.0)

## 2019-11-01 MED ORDER — LISINOPRIL-HYDROCHLOROTHIAZIDE 20-25 MG PO TABS: 1.0000 | ORAL_TABLET | Freq: Every day | ORAL | 3 refills | Status: DC

## 2019-11-01 MED ORDER — MONTELUKAST SODIUM 10 MG PO TABS: 10.0000 mg | ORAL_TABLET | Freq: Every day | ORAL | 3 refills | Status: DC

## 2019-11-01 MED ORDER — CITALOPRAM HYDROBROMIDE 20 MG PO TABS: 20.0000 mg | ORAL_TABLET | Freq: Every day | ORAL | 3 refills | Status: DC

## 2019-11-01 MED ORDER — DICLOFENAC SODIUM 75 MG PO TBEC: 75.0000 mg | DELAYED_RELEASE_TABLET | Freq: Two times a day (BID) | ORAL | 0 refills | Status: DC | PRN

## 2019-11-01 NOTE — Progress Notes (Signed)
Subjective:    Patient ID: Christina Meadows, female    DOB: [birth year], 69 y.o.   MRN: [id number]  HPI Chief Complaint  Patient presents with  . Follow-up    New bladder issues the last few months. / Foot pain - big toe joint   This is a 69 yo female who presents today for follow up of chronic medical issues.   Bladder- over last 2 months, more difficulty holding urine, irritation, nocturia x 2. Increased leaking during day. Also with sneezing, coughing, lifting. Had hysterectomy 10 years ago. Usually drinks 16 ounces of water. Urine is light coffee colored.   Abdomen- for several months, pain under ribs. Has fallen a couple of times on abdomen in the past. Discomfort. No acid indigestion, no bloating. No food triggers. No diarrhea or constipation. Some pain under right breast.   Right foot pain- right great toe, pain over last 6 months, worse over last 2-3 weeks.   Moved in to a camper on her daughter's property.  Is happy with this arrangement.  Review of Systems Per HPI    Objective:   Physical Exam Vitals reviewed.  Constitutional:      General: She is not in acute distress.    Appearance: Normal appearance. She is obese. She is not ill-appearing, toxic-appearing or diaphoretic.  HENT:     Head: Atraumatic.  Eyes:     Conjunctiva/sclera: Conjunctivae normal.  Cardiovascular:     Rate and Rhythm: Normal rate.  Pulmonary:     Effort: Pulmonary effort is normal.  Abdominal:     General: Abdomen is flat. There is no distension.     Palpations: Abdomen is soft. There is no mass.     Tenderness: There is no abdominal tenderness. There is no guarding or rebound.     Hernia: No hernia is present.  Genitourinary:    General: Normal vulva.     Exam position: Supine.     Pubic Area: No rash.      Labia:        Right: No rash, tenderness, lesion or injury.        Left: No rash, tenderness, lesion or injury.      Urethra: No prolapse or urethral pain.     Vagina: No vaginal  discharge, erythema, tenderness, bleeding or prolapsed vaginal walls.     Uterus: Absent.      Comments: Vaginal wall pale. Loss of rugae.  Musculoskeletal:     Comments: Right foot bunion.  No nail deformity or erythema.  Skin:    General: Skin is warm and dry.  Neurological:     Mental Status: She is alert and oriented to person, place, and time.       BP 124/72 (BP Location: Left Arm, Patient Position: Sitting, Cuff Size: Normal)   Pulse 82   Temp 97.8 F (36.6 C) (Temporal)   Ht 4\' 11"  (1.499 m)   Wt 191 lb 6.4 oz (86.8 kg)   SpO2 95%   BMI 38.66 kg/m  Wt Readings from Last 3 Encounters:  11/01/19 191 lb 6.4 oz (86.8 kg)  10/10/19 193 lb 12.8 oz (87.9 kg)  05/03/19 187 lb 12.8 oz (85.2 kg)   Results for orders placed or performed in visit on 11/01/19  Urinalysis Dipstick  Result Value Ref Range   Color, UA light yellow    Clarity, UA clear    Glucose, UA Negative Negative   Bilirubin, UA neg    Ketones, UA neg  Spec Grav, UA 1.015 1.010 - 1.025   Blood, UA neg    pH, UA 6.0 5.0 - 8.0   Protein, UA Negative Negative   Urobilinogen, UA 0.2 0.2 or 1.0 E.U./dL   Nitrite, UA neg    Leukocytes, UA Negative Negative   Appearance     Odor         Assessment & Plan:  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  Provided written and verbal information regarding increasing fluids, performing Kegel exercises.  Also discussed referral to pelvic floor physical therapy and patient will think about this.  Can also consider vaginal estrogen. - Urinalysis Dipstick  2. Essential hypertension -Well-controlled, continue current meds - lisinopril-hydrochlorothiazide (ZESTORETIC) 20-25 MG tablet; Take 1 tablet by mouth daily.  Dispense: 90 tablet; Refill: 3  3. Right foot pain - Ambulatory referral to Podiatry  4. Generalized abdominal pain -Possibly musculoskeletal in nature, will check labs and continue to monitor she will let me know if  worsening - Comprehensive metabolic panel - CBC with Differential  5. Allergic rhinitis, unspecified seasonality, unspecified trigger - montelukast (SINGULAIR) 10 MG tablet; Take 1 tablet (10 mg total) by mouth at bedtime.  Dispense: 90 tablet; Refill: 3  6. Depression, recurrent ([place]) -Stable, continue current meds - citalopram (CELEXA) 20 MG tablet; Take 1 tablet (20 mg total) by mouth daily.  Dispense: 90 tablet; Refill: 3  This visit occurred during the SARS-CoV-2 public health emergency.  Safety protocols were in place, including screening questions prior to the visit, additional usage of staff PPE, and extensive cleaning of exam room while observing appropriate contact time as indicated for disinfecting solutions.    Clarene Reamer, FNP-BC  [facility]  11/05/2019 11:46 AM

## 2019-11-01 NOTE — Patient Instructions (Addendum)
I have put in a referral to podiatry   Urinary Incontinence  Urinary incontinence refers to a condition in which a person is unable to control where and when to pass urine. A person with this condition will urinate when he or she does not mean to (involuntarily). What are the causes? This condition may be caused by:  Medicines.  Infections.  Constipation.  Overactive bladder muscles.  Weak bladder muscles.  Weak pelvic floor muscles. These muscles provide support for the bladder, intestine, and, in women, the uterus.  Enlarged prostate in men. The prostate is a gland near the bladder. When it gets too big, it can pinch the urethra. With the urethra blocked, the bladder can weaken and lose the ability to empty properly.  Surgery.  Emotional factors, such as anxiety, stress, or post-traumatic stress disorder (PTSD).  Pelvic organ prolapse. This happens in women when organs shift out of place and into the vagina. This shift can prevent the bladder and urethra from working properly. What increases the risk? The following factors may make you more likely to develop this condition:  Older age.  Obesity and physical inactivity.  Pregnancy and childbirth.  Menopause.  Diseases that affect the nerves or spinal cord (neurological diseases).  Long-term (chronic) coughing. This can increase pressure on the bladder and pelvic floor muscles. What are the signs or symptoms? Symptoms may vary depending on the type of urinary incontinence you have. They include:  A sudden urge to urinate, but passing urine involuntarily before you can get to a bathroom (urge incontinence).  Suddenly passing urine with any activity that forces urine to pass, such as coughing, laughing, exercise, or sneezing (stress incontinence).  Needing to urinate often, but urinating only a small amount, or constantly dribbling urine (overflow incontinence).  Urinating because you cannot get to the bathroom in  time due to a physical disability, such as arthritis or injury, or communication and thinking problems, such as Alzheimer disease (functional incontinence). How is this diagnosed? This condition may be diagnosed based on:  Your medical history.  A physical exam.  Tests, such as: ? Urine tests. ? X-rays of your kidney and bladder. ? Ultrasound. ? CT scan. ? Cystoscopy. In this procedure, a health care provider inserts a tube with a light and camera (cystoscope) through the urethra and into the bladder in order to check for problems. ? Urodynamic testing. These tests assess how well the bladder, urethra, and sphincter can store and release urine. There are different types of urodynamic tests, and they vary depending on what the test is measuring. To help diagnose your condition, your health care provider may recommend that you keep a log of when you urinate and how much you urinate. How is this treated? Treatment for this condition depends on the type of incontinence that you have and its cause. Treatment may include:  Lifestyle changes, such as: ? Quitting smoking. ? Maintaining a healthy weight. ? Staying active. Try to get 150 minutes of moderate-intensity exercise every week. Ask your health care provider which activities are safe for you. ? Eating a healthy diet.  Avoid high-fat foods, like fried foods.  Avoid refined carbohydrates like white bread and white rice.  Limit how much alcohol and caffeine you drink.  Increase your fiber intake. Foods such as fresh fruits, vegetables, beans, and whole grains are healthy sources of fiber.  Pelvic floor muscle exercises.  Bladder training, such as lengthening the amount of time between bathroom breaks, or using the bathroom  at regular intervals.  Using techniques to suppress bladder urges. This can include distraction techniques or controlled breathing exercises.  Medicines to relax the bladder muscles and prevent bladder  spasms.  Medicines to help slow or prevent the growth of a man's prostate.  Botox injections. These can help relax the bladder muscles.  Using pulses of electricity to help change bladder reflexes (electrical nerve stimulation).  For women, using a medical device to prevent urine leaks. This is a small, tampon-like, disposable device that is inserted into the urethra.  Injecting collagen or carbon beads (bulking agents) into the urinary sphincter. These can help thicken tissue and close the bladder opening.  Surgery. Follow these instructions at home: Lifestyle  Limit alcohol and caffeine. These can fill your bladder quickly and irritate it.  Keep yourself clean to help prevent odors and skin damage. Ask your doctor about special skin creams and cleansers that can protect the skin from urine.  Consider wearing pads or adult diapers. Make sure to change them regularly, and always change them right after experiencing incontinence. General instructions  Take over-the-counter and prescription medicines only as told by your health care provider.  Use the bathroom about every 3-4 hours, even if you do not feel the need to urinate. Try to empty your bladder completely every time. After urinating, wait a minute. Then try to urinate again.  Make sure you are in a relaxed position while urinating.  If your incontinence is caused by nerve problems, keep a log of the medicines you take and the times you go to the bathroom.  Keep all follow-up visits as told by your health care provider. This is important. Contact a health care provider if:  You have pain that gets worse.  Your incontinence gets worse. Get help right away if:  You have a fever or chills.  You are unable to urinate.  You have redness in your groin area or down your legs. Summary  Urinary incontinence refers to a condition in which a person is unable to control where and when to pass urine.  This condition may be  caused by medicines, infection, weak bladder muscles, weak pelvic floor muscles, enlargement of the prostate (in men), or surgery.  The following factors increase your risk for developing this condition: older age, obesity, pregnancy and childbirth, menopause, neurological diseases, and chronic coughing.  There are several types of urinary incontinence. They include urge incontinence, stress incontinence, overflow incontinence, and functional incontinence.  This condition is usually treated first with lifestyle and behavioral changes, such as quitting smoking, eating a healthier diet, and doing regular pelvic floor exercises. Other treatment options include medicines, bulking agents, medical devices, electrical nerve stimulation, or surgery. This information is not intended to replace advice given to you by your health care provider. Make sure you discuss any questions you have with your health care provider. Document Revised: 09/03/2017 Document Reviewed: 12/03/2016 Elsevier Patient Education  [place].

## 2019-11-02 LAB — COMPREHENSIVE METABOLIC PANEL
ALT: 12 U/L (ref 0–35)
AST: 14 U/L (ref 0–37)
Albumin: 4.1 g/dL (ref 3.5–5.2)
Alkaline Phosphatase: 87 U/L (ref 39–117)
BUN: 11 mg/dL (ref 6–23)
CO2: 31 mEq/L (ref 19–32)
Calcium: 9.9 mg/dL (ref 8.4–10.5)
Chloride: 99 mEq/L (ref 96–112)
Creatinine, Ser: 0.74 mg/dL (ref 0.40–1.20)
GFR: 78.07 mL/min (ref >60.00)
Glucose, Bld: 87 mg/dL (ref 70–99)
Potassium: 4.1 mEq/L (ref 3.5–5.1)
Sodium: 139 mEq/L (ref 135–145)
Total Bilirubin: 0.6 mg/dL (ref 0.2–1.2)
Total Protein: 7.4 g/dL (ref 6.0–8.3)

## 2019-11-02 LAB — CBC WITH DIFFERENTIAL/PLATELET
Basophils Absolute: 0.1 10*3/uL (ref 0.0–0.1)
Basophils Relative: 1.2 % (ref 0.0–3.0)
Eosinophils Absolute: 0.3 10*3/uL (ref 0.0–0.7)
Eosinophils Relative: 3.1 % (ref 0.0–5.0)
HCT: 42.2 % (ref 36.0–46.0)
Hemoglobin: 14.2 g/dL (ref 12.0–15.0)
Lymphocytes Relative: 27.6 % (ref 12.0–46.0)
Lymphs Abs: 2.8 10*3/uL (ref 0.7–4.0)
MCHC: 33.7 g/dL (ref 30.0–36.0)
MCV: 92.6 fl (ref 78.0–100.0)
Monocytes Absolute: 0.8 10*3/uL (ref 0.1–1.0)
Monocytes Relative: 8.1 % (ref 3.0–12.0)
Neutro Abs: 6.1 10*3/uL (ref 1.4–7.7)
Neutrophils Relative %: 60 % (ref 43.0–77.0)
Platelets: 225 10*3/uL (ref 150.0–400.0)
RBC: 4.55 Mil/uL (ref 3.87–5.11)
RDW: 13.8 % (ref 11.5–15.5)
WBC: 10.2 10*3/uL (ref 4.0–10.5)

## 2019-11-21 ENCOUNTER — Ambulatory Visit (INDEPENDENT_AMBULATORY_CARE_PROVIDER_SITE_OTHER): Payer: Medicare Other

## 2019-11-21 ENCOUNTER — Other Ambulatory Visit: Payer: Self-pay

## 2019-11-21 ENCOUNTER — Encounter: Payer: Self-pay | Admitting: Podiatry

## 2019-11-21 ENCOUNTER — Ambulatory Visit (INDEPENDENT_AMBULATORY_CARE_PROVIDER_SITE_OTHER): Payer: Medicare Other | Admitting: Podiatry

## 2019-11-21 VITALS — Temp 97.3°F

## 2019-11-21 DIAGNOSIS — M205X1 Other deformities of toe(s) (acquired), right foot: Secondary | ICD-10-CM | POA: Diagnosis not present

## 2019-11-21 DIAGNOSIS — M19071 Primary osteoarthritis, right ankle and foot: Secondary | ICD-10-CM

## 2019-11-22 ENCOUNTER — Encounter: Payer: Self-pay | Admitting: Podiatry

## 2019-11-22 ENCOUNTER — Other Ambulatory Visit: Payer: Self-pay | Admitting: Family Medicine

## 2019-11-22 DIAGNOSIS — N3946 Mixed incontinence: Secondary | ICD-10-CM

## 2019-11-22 DIAGNOSIS — E2839 Other primary ovarian failure: Secondary | ICD-10-CM

## 2019-11-22 MED ORDER — NONFORMULARY OR COMPOUNDED ITEM: 1.0000 g | 5 refills | Status: DC

## 2019-11-22 NOTE — Progress Notes (Signed)
Subjective:  Patient ID: Christina Meadows, female    DOB: 04-03-1951,  MRN: [id number]  Chief Complaint  Patient presents with  . Foot Pain    Patient presents today for right hallux, mpj joint pain and pain under forefoot x  50month.  She states "it burns, aches and is worse at night.  It seems to be worse at night and it also swells"  She has been soaking in Epson salt and taking Diclofenac for treatment     69y.o. female presents with the above complaint.  Patient presents with painful first metatarsophalangeal joint on the right foot.  Patient states that this has been going on for a long period of time.  The pain is more burning and aching right in the joint.  Worse at nights and it swells at night.  It is a constant pain.  Patient has tried soaking in Epsom salt and diclofenac cream which has helped a little bit but not much.  And there is a pressure associated with the right under the first metatarsophalangeal joint.  She denies any other acute complaints.  She would like to know if there is any other treatment options for this.  She has not seen anyone else for this.   Review of Systems: Negative except as noted in the HPI. Denies N/V/F/Ch.  Past Medical History:  Diagnosis Date  . Arthritis   . Hyperlipidemia   . Hypertension   . OSA on CPAP 2019    Current Outpatient Medications:  .  acetaminophen (TYLENOL) 500 MG tablet, Take 1,000 mg by mouth daily. Taking as needed, Disp: , Rfl:  .  cetirizine (ZYRTEC) 10 MG tablet, Take by mouth., Disp: , Rfl:  .  cholecalciferol (VITAMIN D3) 25 MCG (1000 UNIT) tablet, Take 1,000 Units by mouth daily., Disp: , Rfl:  .  citalopram (CELEXA) 20 MG tablet, Take 1 tablet (20 mg total) by mouth daily., Disp: 90 tablet, Rfl: 3 .  diclofenac (VOLTAREN) 75 MG EC tablet, Take 1 tablet (75 mg total) by mouth 2 (two) times daily as needed. TAKE 1 TABLET BY MOUTH TWICE DAILY (IF USING THIS MEDICATION DO NOT USE CELEBREX OR OTHER ANTI INFLAMMATORY), Disp:  60 tablet, Rfl: 0 .  fluticasone (FLONASE) 50 MCG/ACT nasal spray, Place 2 sprays into both nostrils daily., Disp: 16 g, Rfl: 5 .  lisinopril-hydrochlorothiazide (ZESTORETIC) 20-25 MG tablet, Take 1 tablet by mouth daily., Disp: 90 tablet, Rfl: 3 .  montelukast (SINGULAIR) 10 MG tablet, Take 1 tablet (10 mg total) by mouth at bedtime., Disp: 90 tablet, Rfl: 3 .  UNABLE TO FIND, Med Name: CPAP (pressure 11), Disp: , Rfl:   Social History   Tobacco Use  Smoking Status Former Smoker  . Types: Cigarettes  . Quit date: 09/07/1978  . Years since quitting: 41.2  Smokeless Tobacco Never Used    No Known Allergies Objective:   Vitals:   11/21/19 1521  Temp: (!) 97.3 F (36.3 C)   There is no height or weight on file to calculate BMI. Constitutional Well developed. Well nourished.  Vascular Dorsalis pedis pulses palpable bilaterally. Posterior tibial pulses palpable bilaterally. Capillary refill normal to all digits.  No cyanosis or clubbing noted. Pedal hair growth normal.  Neurologic Normal speech. Oriented to person, place, and time. Epicritic sensation to light touch grossly present bilaterally.  Dermatologic Nails well groomed and normal in appearance. No open wounds. No skin lesions.  Orthopedic:  Pain on palpation to the right metatarsophalangeal joint.  Limited range of motion associated with hallux rigidus findings.  Intra-articular pain noted.  Pain with range of motion of the first metatarsophalangeal joint active and passive.  Mild pain at the sesamoid complex.  No pain at the second metatarsophalangeal joint or any other respective metatarsophalangeal joint.  Dorsal prominence noted   Radiographs: 2 views of skeletally mature adult foot: Plantar heel spurring noted.  Mild elevation of the first metatarsal noted.  Severe arthritic changes noted of the met metatarsophalangeal joint first on the right.  Dorsal flag sign noted.  Mild arthritic changes noted noted at the midfoot.   Sesamoid position is in within normal limits.  No other bony abnormalities identified.  Assessment:   1. Hallux limitus of right foot    Plan:  Patient was evaluated and treated and all questions answered.  Right first metatarsophalangeal joint arthritis -I explained to the patient the etiology of arthritis and various treatment options were extensively discussed with the patient.  Given that she has a lot of acute pain involved with the first metatarsophalangeal joint I believe she will benefit from a steroid injection to help help decrease the inflammatory component of the pain.  Patient agrees with the plan would like to proceed with injection.  However I did discuss with her briefly that given the amount of arthritis that is present patient may need surgical intervention to help with the pain.  But for now if she is getting enough pain relief with injection I will continue to perform injection as needed. -A steroid injection was performed at right first metatarsophalangeal joint using 1% plain Lidocaine and 10 mg of Kenalog. This was well tolerated.   No follow-ups on file.

## 2019-11-24 DIAGNOSIS — J3501 Chronic tonsillitis: Secondary | ICD-10-CM | POA: Diagnosis not present

## 2019-11-24 DIAGNOSIS — R070 Pain in throat: Secondary | ICD-10-CM | POA: Diagnosis not present

## 2019-12-13 DIAGNOSIS — R070 Pain in throat: Secondary | ICD-10-CM | POA: Diagnosis not present

## 2019-12-13 DIAGNOSIS — J3501 Chronic tonsillitis: Secondary | ICD-10-CM | POA: Diagnosis not present

## 2019-12-25 ENCOUNTER — Ambulatory Visit: Payer: Medicare Other | Admitting: Podiatry

## 2019-12-26 ENCOUNTER — Other Ambulatory Visit: Payer: Self-pay

## 2019-12-26 ENCOUNTER — Ambulatory Visit (INDEPENDENT_AMBULATORY_CARE_PROVIDER_SITE_OTHER): Payer: Medicare Other | Admitting: Podiatry

## 2019-12-26 DIAGNOSIS — M19071 Primary osteoarthritis, right ankle and foot: Secondary | ICD-10-CM

## 2019-12-26 DIAGNOSIS — M79671 Pain in right foot: Secondary | ICD-10-CM | POA: Diagnosis not present

## 2019-12-27 ENCOUNTER — Encounter: Payer: Self-pay | Admitting: Podiatry

## 2019-12-27 NOTE — Progress Notes (Signed)
Subjective:  Patient ID: Christina Meadows, female    DOB: [birth year],  MRN: [id number]  Chief Complaint  Patient presents with  . Foot Pain    pt is here for a 4 week arthritis f/u, pt states that she has no pain, and has no other concerns    69 y.o. female presents with the above complaint.  Patient's presents with painful right first metatarsophalangeal joint arthritis.  Patient states the injection completely resolved her pain.  She states she is able to ambulate without any pain.  She does not have any other concerns.  She is doing a lot better overall.  Her pain scale 0 out of 10.  She denies any other acute complaints.  Review of Systems: Negative except as noted in the HPI. Denies N/V/F/Ch.  Past Medical History:  Diagnosis Date  . Arthritis   . Hyperlipidemia   . Hypertension   . OSA on CPAP 2019    Current Outpatient Medications:  .  acetaminophen (TYLENOL) 500 MG tablet, Take 1,000 mg by mouth daily. Taking as needed, Disp: , Rfl:  .  cetirizine (ZYRTEC) 10 MG tablet, Take by mouth., Disp: , Rfl:  .  cholecalciferol (VITAMIN D3) 25 MCG (1000 UNIT) tablet, Take 1,000 Units by mouth daily., Disp: , Rfl:  .  citalopram (CELEXA) 20 MG tablet, Take 1 tablet (20 mg total) by mouth daily., Disp: 90 tablet, Rfl: 3 .  clindamycin (CLEOCIN) 300 MG capsule, , Disp: , Rfl:  .  diclofenac (VOLTAREN) 75 MG EC tablet, Take 1 tablet (75 mg total) by mouth 2 (two) times daily as needed. TAKE 1 TABLET BY MOUTH TWICE DAILY (IF USING THIS MEDICATION DO NOT USE CELEBREX OR OTHER ANTI INFLAMMATORY), Disp: 60 tablet, Rfl: 0 .  fluticasone (FLONASE) 50 MCG/ACT nasal spray, Place 2 sprays into both nostrils daily., Disp: 16 g, Rfl: 5 .  lisinopril-hydrochlorothiazide (ZESTORETIC) 20-25 MG tablet, Take 1 tablet by mouth daily., Disp: 90 tablet, Rfl: 3 .  montelukast (SINGULAIR) 10 MG tablet, Take 1 tablet (10 mg total) by mouth at bedtime., Disp: 90 tablet, Rfl: 3 .  NONFORMULARY OR COMPOUNDED ITEM,  Place 1 g vaginally 2 (two) times a week., Disp: 8 each, Rfl: 5 .  UNABLE TO FIND, Med Name: CPAP (pressure 11), Disp: , Rfl:   Social History   Tobacco Use  Smoking Status Former Smoker  . Types: Cigarettes  . Quit date: 09/07/1978  . Years since quitting: 41.3  Smokeless Tobacco Never Used    No Known Allergies Objective:   There were no vitals filed for this visit. There is no height or weight on file to calculate BMI. Constitutional Well developed. Well nourished.  Vascular Dorsalis pedis pulses palpable bilaterally. Posterior tibial pulses palpable bilaterally. Capillary refill normal to all digits.  No cyanosis or clubbing noted. Pedal hair growth normal.  Neurologic Normal speech. Oriented to person, place, and time. Epicritic sensation to light touch grossly present bilaterally.  Dermatologic Nails well groomed and normal in appearance. No open wounds. No skin lesions.  Orthopedic:  No pain on palpation to the right metatarsophalangeal joint.  Limited range of motion associated with hallux rigidus findings.  No intra-articular pain noted.  No pain with range of motion of the first metatarsophalangeal joint active and passive.  Mild pain at the sesamoid complex.  No pain at the second metatarsophalangeal joint or any other respective metatarsophalangeal joint.  Dorsal prominence noted   Radiographs: 2 views of skeletally mature adult foot: Plantar  heel spurring noted.  Mild elevation of the first metatarsal noted.  Severe arthritic changes noted of the met metatarsophalangeal joint first on the right.  Dorsal flag sign noted.  Mild arthritic changes noted noted at the midfoot.  Sesamoid position is in within normal limits.  No other bony abnormalities identified.  Assessment:   1. Arthritis of first metatarsophalangeal (MTP) joint of right foot   2. Pain in right foot    Plan:  Patient was evaluated and treated and all questions answered.  Right first  metatarsophalangeal joint arthritis -Pain clinically resolved with 1 steroid injection.  I discussed with the patient that if her pain returns anytime in the future to come back and see me for another steroid injection.  Patient states understanding.  I did have a discussion with her that given the amount of arthritis that she has in the first metatarsal joint she may need surgical intervention to properly address that.  However for now the steroid injection seems to be helping really well and therefore we will continue to do them until and if they are no longer working.  Patient states understanding would like to proceed with steroid injections for now.    No follow-ups on file.

## 2020-04-16 DIAGNOSIS — D2271 Melanocytic nevi of right lower limb, including hip: Secondary | ICD-10-CM | POA: Diagnosis not present

## 2020-04-16 DIAGNOSIS — L821 Other seborrheic keratosis: Secondary | ICD-10-CM | POA: Diagnosis not present

## 2020-04-16 DIAGNOSIS — D2262 Melanocytic nevi of left upper limb, including shoulder: Secondary | ICD-10-CM | POA: Diagnosis not present

## 2020-04-16 DIAGNOSIS — D2261 Melanocytic nevi of right upper limb, including shoulder: Secondary | ICD-10-CM | POA: Diagnosis not present

## 2020-04-16 DIAGNOSIS — D2272 Melanocytic nevi of left lower limb, including hip: Secondary | ICD-10-CM | POA: Diagnosis not present

## 2020-04-16 DIAGNOSIS — D225 Melanocytic nevi of trunk: Secondary | ICD-10-CM | POA: Diagnosis not present

## 2020-04-28 ENCOUNTER — Other Ambulatory Visit: Payer: Self-pay | Admitting: Family Medicine

## 2020-04-28 DIAGNOSIS — I10 Essential (primary) hypertension: Secondary | ICD-10-CM

## 2020-04-28 DIAGNOSIS — M810 Age-related osteoporosis without current pathological fracture: Secondary | ICD-10-CM

## 2020-05-02 ENCOUNTER — Ambulatory Visit: Payer: Medicare Other

## 2020-05-02 ENCOUNTER — Other Ambulatory Visit (INDEPENDENT_AMBULATORY_CARE_PROVIDER_SITE_OTHER): Payer: Medicare Other

## 2020-05-02 ENCOUNTER — Other Ambulatory Visit: Payer: Self-pay

## 2020-05-02 DIAGNOSIS — M810 Age-related osteoporosis without current pathological fracture: Secondary | ICD-10-CM | POA: Diagnosis not present

## 2020-05-02 DIAGNOSIS — I10 Essential (primary) hypertension: Secondary | ICD-10-CM

## 2020-05-02 LAB — LIPID PANEL
Cholesterol: 236 mg/dL — ABNORMAL HIGH (ref 0–200)
HDL: 69.7 mg/dL (ref >39.00)
LDL Cholesterol: 136 mg/dL — ABNORMAL HIGH (ref 0–99)
NonHDL: 166.71
Total CHOL/HDL Ratio: 3
Triglycerides: 152 mg/dL — ABNORMAL HIGH (ref 0.0–149.0)
VLDL: 30.4 mg/dL (ref 0.0–40.0)

## 2020-05-02 LAB — BASIC METABOLIC PANEL
BUN: 14 mg/dL (ref 6–23)
CO2: 30 mEq/L (ref 19–32)
Calcium: 9.3 mg/dL (ref 8.4–10.5)
Chloride: 101 mEq/L (ref 96–112)
Creatinine, Ser: 0.78 mg/dL (ref 0.40–1.20)
GFR: 73.36 mL/min (ref >60.00)
Glucose, Bld: 105 mg/dL — ABNORMAL HIGH (ref 70–99)
Potassium: 3.7 mEq/L (ref 3.5–5.1)
Sodium: 140 mEq/L (ref 135–145)

## 2020-05-02 LAB — VITAMIN D 25 HYDROXY (VIT D DEFICIENCY, FRACTURES): VITD: 40.53 ng/mL (ref 30.00–100.00)

## 2020-05-06 ENCOUNTER — Ambulatory Visit (INDEPENDENT_AMBULATORY_CARE_PROVIDER_SITE_OTHER)
Admission: RE | Admit: 2020-05-06 | Discharge: 2020-05-06 | Disposition: A | Discharge status: Home / Self Care | Payer: Medicare Other | Source: Ambulatory Visit | Attending: Family Medicine | Admitting: Family Medicine

## 2020-05-06 ENCOUNTER — Ambulatory Visit: Payer: Medicare Other

## 2020-05-06 ENCOUNTER — Encounter: Payer: Self-pay | Admitting: Family Medicine

## 2020-05-06 ENCOUNTER — Other Ambulatory Visit: Payer: Self-pay

## 2020-05-06 ENCOUNTER — Ambulatory Visit (INDEPENDENT_AMBULATORY_CARE_PROVIDER_SITE_OTHER): Payer: Medicare Other | Admitting: Family Medicine

## 2020-05-06 VITALS — BP 116/60 | HR 73 | Temp 98.7°F | Ht 59.0 in | Wt 174.5 lb

## 2020-05-06 DIAGNOSIS — F339 Major depressive disorder, recurrent, unspecified: Secondary | ICD-10-CM | POA: Diagnosis not present

## 2020-05-06 DIAGNOSIS — R1030 Lower abdominal pain, unspecified: Secondary | ICD-10-CM | POA: Diagnosis not present

## 2020-05-06 DIAGNOSIS — R634 Abnormal weight loss: Secondary | ICD-10-CM | POA: Diagnosis not present

## 2020-05-06 DIAGNOSIS — G8929 Other chronic pain: Secondary | ICD-10-CM

## 2020-05-06 DIAGNOSIS — M25512 Pain in left shoulder: Secondary | ICD-10-CM

## 2020-05-06 DIAGNOSIS — Z1231 Encounter for screening mammogram for malignant neoplasm of breast: Secondary | ICD-10-CM | POA: Diagnosis not present

## 2020-05-06 DIAGNOSIS — M542 Cervicalgia: Secondary | ICD-10-CM

## 2020-05-06 LAB — POC URINALSYSI DIPSTICK (AUTOMATED)
Bilirubin, UA: NEGATIVE
Blood, UA: NEGATIVE
Glucose, UA: POSITIVE — AB
Ketones, UA: NEGATIVE
Leukocytes, UA: NEGATIVE
Nitrite, UA: NEGATIVE
Protein, UA: NEGATIVE
Spec Grav, UA: 1.02 (ref 1.010–1.025)
Urobilinogen, UA: 0.2 E.U./dL
pH, UA: 6.5 (ref 5.0–8.0)

## 2020-05-06 MED ORDER — CITALOPRAM HYDROBROMIDE 40 MG PO TABS: 40.0000 mg | ORAL_TABLET | Freq: Every day | ORAL | 3 refills | Status: AC

## 2020-05-06 NOTE — Progress Notes (Signed)
Subjective:    Patient ID: Christina Meadows, female    DOB: [birth year], 69 y.o.   MRN: [id number]  HPI This is a 69 year old female who presents today for follow-up of chronic medical conditions.  Lower abdominal pain, pain in upper chest through back for several months. Pain constant, pressure, sore. Chest sore to the touch. Decreased appetite. Fatigue. Normal BM, daily. No blood.  Has been limiting sugars and starches.  Left shoulder pain, with lifting, catches.   Good water intake, urine yellow- clear.   Increased stress, anxiety- watches her 86 month old great granddaughter 5 days a week. 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 Also helps with school aged grand children in afternoons.   Review of Systems  Constitutional: Positive for fatigue.  HENT: Negative.   Eyes: Positive for visual disturbance (floaters).  Respiratory: Negative.        Soreness with deep breathing.   Gastrointestinal: Positive for abdominal pain. Negative for blood in stool, constipation and diarrhea.  Endocrine: Positive for heat intolerance.  Genitourinary: Negative.   Musculoskeletal: Positive for myalgias.  Neurological: Positive for headaches (not using CPAP as much due to heat.follow up every Feb. ).  Psychiatric/Behavioral: The patient is nervous/anxious.        Objective:   Physical Exam Physical Exam  Constitutional: She is oriented to person, place, and time. She appears well-developed and well-nourished. No distress.  HENT:  Head: Normocephalic and atraumatic.  Right Ear: External ear normal.  Left Ear: External ear normal.  Eyes: Conjunctivae are normal.   Neck: Slightly decreased range of motion, left rotation. Left trapezius tenderness, no point tenderness. Neck supple. No JVD present. No thyromegaly present.  Cardiovascular: Normal rate, regular rhythm, normal heart sounds and intact distal pulses.   Pulmonary/Chest: Effort normal and breath sounds normal. Right breast exhibits no inverted  nipple, no mass, no nipple discharge, no skin change and no tenderness. Left breast exhibits no inverted nipple, no mass, no nipple discharge, no skin change and no tenderness. Breasts are symmetrical.  Abdominal: Soft. Bowel sounds are normal. She exhibits no distension and no mass. There is mild lower abdominal tenderness. There is no rebound and no guarding.  Musculoskeletal: No LE edema Lymphadenopathy:    She has no cervical adenopathy.  Neurological: She is alert and oriented to person, place, and time.   Skin: Skin is warm and dry. She is not diaphoretic.  Psychiatric: She has a normal mood and affect. Her behavior is normal. Judgment and thought content normal.  Vitals reviewed.   BP 116/60   Pulse 73   Temp 98.7 F (37.1 C) (Temporal)   Ht 4\' 11"  (1.499 m)   Wt 174 lb 8 oz (79.2 kg)   SpO2 96%   BMI 35.24 kg/m   Wt Readings from Last 3 Encounters:  05/06/20 174 lb 8 oz (79.2 kg)  11/01/19 191 lb 6.4 oz (86.8 kg)  10/10/19 193 lb 12.8 oz (87.9 kg)   Depression screen [facility] 2/9 05/06/2020 05/01/2019 11/10/2017 11/10/2017  Decreased Interest 0 0 0 0  Down, Depressed, Hopeless 0 0 0 0  PHQ - 2 Score 0 0 0 0  Altered sleeping - 0 - -  Tired, decreased energy - 0 - -  Change in appetite - 0 - -  Feeling bad or failure about yourself  - 0 - -  Trouble concentrating - 0 - -  Moving slowly or fidgety/restless - 0 - -  Suicidal thoughts - 0 - -  PHQ-9 Score - 0 - -   Results for orders placed or performed in visit on 05/06/20  POCT Urinalysis Dipstick (Automated)  Result Value Ref Range   Color, UA yellow    Clarity, UA clear    Glucose, UA Positive (A) Negative   Bilirubin, UA neg    Ketones, UA neg    Spec Grav, UA 1.020 1.010 - 1.025   Blood, UA neg    pH, UA 6.5 5.0 - 8.0   Protein, UA Negative Negative   Urobilinogen, UA 0.2 0.2 or 1.0 E.U./dL   Nitrite, UA neg    Leukocytes, UA Negative Negative        Assessment & Plan:  1. Lower abdominal pain - unclear  etiology but with weight loss, will get CT abdomen/ pelvis - CT Abdomen Pelvis Wo Contrast; Future - POCT Urinalysis Dipstick (Automated)  2. Encounter for screening mammogram for malignant neoplasm of breast - MM Digital Screening; Future  3. Unintended weight loss - DG Chest 2 View; Future - CT Abdomen Pelvis Wo Contrast; Future  4. Depression, recurrent ([place]) -Discussed the option of going up on citalopram or weaning and starting new medication.  Patient prefers to go on higher dose of citalopram. - citalopram (CELEXA) 40 MG tablet; Take 1 tablet (40 mg total) by mouth daily.  Dispense: 90 tablet; Refill: 3  5. Neck pain -Was previously taking diclofenac regularly, has not been taking recently.  Suspect some of her increased pain is related to coming off of her NSAID.  Discussed heat/ice, gentle range of motion  6. Chronic left shoulder pain -Intermittent, good range of motion, nontender to palpation -Discussed using heat and/or ice, regular range of motion.  Will consider adding back NSAID once abdominal pain worked up.  This visit occurred during the SARS-CoV-2 public health emergency.  Safety protocols were in place, including screening questions prior to the visit, additional usage of staff PPE, and extensive cleaning of exam room while observing appropriate contact time as indicated for disinfecting solutions.      Clarene Reamer, FNP-BC  [facility]  05/06/2020 5:49 PM

## 2020-05-06 NOTE — Patient Instructions (Signed)
Good to see you today  You will get a call about CT scan  You will get a call about chest xray results

## 2020-05-08 ENCOUNTER — Other Ambulatory Visit: Payer: Self-pay | Admitting: Family Medicine

## 2020-05-08 DIAGNOSIS — R634 Abnormal weight loss: Secondary | ICD-10-CM

## 2020-05-08 DIAGNOSIS — R1030 Lower abdominal pain, unspecified: Secondary | ICD-10-CM

## 2020-05-10 ENCOUNTER — Other Ambulatory Visit: Payer: Self-pay

## 2020-05-10 ENCOUNTER — Ambulatory Visit (INDEPENDENT_AMBULATORY_CARE_PROVIDER_SITE_OTHER): Payer: Medicare Other

## 2020-05-10 DIAGNOSIS — Z0000 Encounter for general adult medical examination without abnormal findings: Secondary | ICD-10-CM

## 2020-05-10 NOTE — Progress Notes (Signed)
Subjective:   Christina Meadows is a 69 y.o. female who presents for Medicare Annual (Subsequent) preventive examination.  Review of Systems: N/A      I connected with the patient today by telephone and verified that I am speaking with the correct person using two identifiers. Location patient: home Location nurse: work Persons participating in the telephone visit: patient, nurse.   I discussed the limitations, risks, security and privacy concerns of performing an evaluation and management service by telephone and the availability of in person appointments. I also discussed with the patient that there may be a patient responsible charge related to this service. The patient expressed understanding and verbally consented to this telephonic visit.        Cardiac Risk Factors include: advanced age (>72men, >5 women);hypertension     Objective:    Today's Vitals   There is no height or weight on file to calculate BMI.  Advanced Directives 05/10/2020 05/01/2019 07/06/2017  Does Patient Have a Medical Advance Directive? No No No  Would patient like information on creating a medical advance directive? No - Patient declined - No - Patient declined  Pre-existing out of facility DNR order (yellow form or pink MOST form) - - Yellow form placed in chart (order not valid for inpatient use)    Current Medications (verified) Outpatient Encounter Medications as of 05/10/2020  Medication Sig  . acetaminophen (TYLENOL) 500 MG tablet Take 1,000 mg by mouth daily. Taking as needed  . cetirizine (ZYRTEC) 10 MG tablet Take by mouth.  . cholecalciferol (VITAMIN D3) 25 MCG (1000 UNIT) tablet Take 1,000 Units by mouth daily.  . citalopram (CELEXA) 40 MG tablet Take 1 tablet (40 mg total) by mouth daily.  . fluticasone (FLONASE) 50 MCG/ACT nasal spray Place 2 sprays into both nostrils daily.  Marland Kitchen lisinopril-hydrochlorothiazide (ZESTORETIC) 20-25 MG tablet Take 1 tablet by mouth daily.  . montelukast  (SINGULAIR) 10 MG tablet Take 1 tablet (10 mg total) by mouth at bedtime.  Marland Kitchen UNABLE TO FIND Med Name: CPAP (pressure 11)   No facility-administered encounter medications on file as of 05/10/2020.    Allergies (verified) Patient has no known allergies.   History: Past Medical History:  Diagnosis Date  . Arthritis   . Hyperlipidemia   . Hypertension   . OSA on CPAP 2019   Past Surgical History:  Procedure Laterality Date  . ABDOMINAL HYSTERECTOMY  2000  . COLONOSCOPY    . COLONOSCOPY WITH PROPOFOL N/A 07/06/2017   Procedure: COLONOSCOPY WITH PROPOFOL;  Surgeon: Robert Bellow, MD;  Location: [facility] ENDOSCOPY;  Service: Endoscopy;  Laterality: N/A;  . KNEE SURGERY Left 11/2017  . KNEE SURGERY Right 02/2018  . UPPER GI ENDOSCOPY     Family History  Problem Relation Age of Onset  . Lung cancer Mother 61  . Diabetes Father   . COPD Father   . Arthritis Father   . Colon cancer Brother 5  . Arthritis Brother   . Diabetes Brother   . Early death Brother   . Hearing loss Brother   . Heart disease Brother   . Hyperlipidemia Brother   . Hypertension Brother   . Throat cancer Maternal Grandmother    Social History   Socioeconomic History  . Marital status: Divorced    Spouse name: Not on file  . Number of children: Not on file  . Years of education: Not on file  . Highest education level: Not on file  Occupational History  .  Occupation: retired  Tobacco Use  . Smoking status: Former Smoker    Types: Cigarettes    Quit date: 09/07/1978    Years since quitting: 41.7  . Smokeless tobacco: Never Used  Vaping Use  . Vaping Use: Never used  Substance and Sexual Activity  . Alcohol use: Yes    Comment: occasional  . Drug use: No  . Sexual activity: Not Currently  Other Topics Concern  . Not on file  Social History Narrative  . Not on file   Social Determinants of Health   Financial Resource Strain: Low Risk   . Difficulty of Paying Living Expenses: Not hard at all   Food Insecurity: No Food Insecurity  . Worried About Charity fundraiser in the Last Year: Never true  . Ran Out of Food in the Last Year: Never true  Transportation Needs: No Transportation Needs  . Lack of Transportation (Medical): No  . Lack of Transportation (Non-Medical): No  Physical Activity: Inactive  . Days of Exercise per Week: 0 days  . Minutes of Exercise per Session: 0 min  Stress: No Stress Concern Present  . Feeling of Stress : Not at all  Social Connections:   . Frequency of Communication with Friends and Family: Not on file  . Frequency of Social Gatherings with Friends and Family: Not on file  . Attends Religious Services: Not on file  . Active Member of Clubs or Organizations: Not on file  . Attends Archivist Meetings: Not on file  . Marital Status: Not on file    Tobacco Counseling Counseling given: Not Answered   Clinical Intake:  Pre-visit preparation completed: Yes  Pain : 0-10 Pain Type: Chronic pain Pain Location: Back Pain Orientation: Upper Pain Descriptors / Indicators: Aching Pain Onset: More than a month ago Pain Frequency: Intermittent     Nutritional Risks: None Diabetes: No  How often do you need to have someone help you when you read instructions, pamphlets, or other written materials from your doctor or pharmacy?: 1 - Never What is the last grade level you completed in school?: 10th  Diabetic: No Nutrition Risk Assessment:  Has the patient had any N/V/D within the last 2 months?  No  Does the patient have any non-healing wounds?  No  Has the patient had any unintentional weight loss or weight gain?  No   Diabetes:  Is the patient diabetic?  No  If diabetic, was a CBG obtained today?  No  Did the patient bring in their glucometer from home?  N/A How often do you monitor your CBG's? N/A.   Financial Strains and Diabetes Management:  Are you having any financial strains with the device, your supplies or your  medication? N/A.  Does the patient want to be seen by Chronic Care Management for management of their diabetes?  N/A Would the patient like to be referred to a Nutritionist or for Diabetic Management?  N/A    Interpreter Needed?: No  Information entered by :: CJohnson, LPN   Activities of Daily Living In your present state of health, do you have any difficulty performing the following activities: 05/10/2020  Hearing? N  Vision? N  Difficulty concentrating or making decisions? N  Walking or climbing stairs? N  Dressing or bathing? N  Doing errands, shopping? N  Preparing Food and eating ? N  Using the Toilet? N  In the past six months, have you accidently leaked urine? N  Do you have problems with  loss of bowel control? N  Managing your Medications? N  Managing your Finances? N  Housekeeping or managing your Housekeeping? N  Some recent data might be hidden    Patient Care Team: Elby Beck, FNP as PCP - General (Nurse Practitioner) Robert Bellow, MD as Consulting Physician (General Surgery)  Indicate any recent Medical Services you may have received from other than [facility] providers in the past year (date may be approximate).     Assessment:   This is a routine wellness examination for Brizeyda.  Hearing/Vision screen  Hearing Screening   125Hz  250Hz  500Hz  1000Hz  2000Hz  3000Hz  4000Hz  6000Hz  8000Hz   Right ear:           Left ear:           Vision Screening Comments: Patient gets annual eye exams   Dietary issues and exercise activities discussed: Current Exercise Habits: The patient does not participate in regular exercise at present (is very active in the yard), Exercise limited by: None identified  Goals    . Patient Stated     05/01/2019, wants to increase physical activity    . Patient Stated     05/10/2020, I will maintain and continue medications as prescribed.      Depression Screen PHQ 2/9 Scores 05/10/2020 05/06/2020 05/01/2019 11/10/2017 11/10/2017  PHQ  - 2 Score 0 0 0 0 0  PHQ- 9 Score 0 - 0 - -    Fall Risk Fall Risk  05/10/2020 08/02/2019 05/01/2019 04/10/2019 11/10/2017  Falls in the past year? 0 0 0 (No Data) No  Comment - Emmi Telephone Survey: data to providers prior to load - Emmi Telephone Survey: data to providers prior to load -  Number falls in past yr: 0 - - (No Data) -  Comment - - - Emmi Telephone Survey Actual Response =  -  Injury with Fall? 0 - - - -  Risk for fall due to : Medication side effect - Medication side effect - -  Follow up Falls evaluation completed;Falls prevention discussed - Falls evaluation completed;Falls prevention discussed - -    Any stairs in or around the home? Yes  If so, are there any without handrails? No  Home free of loose throw rugs in walkways, pet beds, electrical cords, etc? Yes  Adequate lighting in your home to reduce risk of falls? Yes   ASSISTIVE DEVICES UTILIZED TO PREVENT FALLS:  Life alert? No  Use of a cane, walker or w/c? No  Grab bars in the bathroom? No  Shower chair or bench in shower? No  Elevated toilet seat or a handicapped toilet? No   TIMED UP AND GO:  Was the test performed? N/A, telephonic visit .    Cognitive Function: MMSE - Mini Mental State Exam 05/10/2020 05/01/2019  Orientation to time 5 5  Orientation to Place 5 5  Registration 3 3  Attention/ Calculation 5 5  Recall 3 3  Language- name 2 objects - 0  Language- repeat 1 0  Language- follow 3 step command - 0  Language- read & follow direction - 0  Write a sentence - 0  Copy design - 0  Total score - 21  Mini Cog  Mini-Cog screen was completed. Maximum score is 22. A value of 0 denotes this part of the MMSE was not completed or the patient failed this part of the Mini-Cog screening.       Immunizations Immunization History  Administered Date(s) Administered  . Fluad  Quad(high Dose 65+) 05/18/2019  . Influenza, High Dose Seasonal PF 06/03/2018  . PFIZER SARS-COV-2 Vaccination 01/12/2020,  02/01/2020  . Pneumococcal Conjugate-13 11/10/2017  . Pneumococcal Polysaccharide-23 05/03/2019    TDAP status: Due, Education has been provided regarding the importance of this vaccine. Advised may receive this vaccine at local pharmacy or Health Dept. Aware to provide a copy of the vaccination record if obtained from local pharmacy or Health Dept. Verbalized acceptance and understanding. Flu Vaccine status: due, will get at later date Pneumococcal vaccine status: Up to date Covid-19 vaccine status: Completed vaccines  Qualifies for Shingles Vaccine? Yes   Zostavax completed No   Shingrix Completed?: No.    Education has been provided regarding the importance of this vaccine. Patient has been advised to call insurance company to determine out of pocket expense if they have not yet received this vaccine. Advised may also receive vaccine at local pharmacy or Health Dept. Verbalized acceptance and understanding.  Screening Tests Health Maintenance  Topic Date Due  . Hepatitis C Screening  Never done  . INFLUENZA VACCINE  04/07/2020  . TETANUS/TDAP  05/10/2024 (Originally 12/08/1969)  . MAMMOGRAM  02/16/2021  . COLONOSCOPY  07/07/2027  . DEXA SCAN  Completed  . COVID-19 Vaccine  Completed  . PNA vac Low Risk Adult  Completed    Health Maintenance  Health Maintenance Due  Topic Date Due  . Hepatitis C Screening  Never done  . INFLUENZA VACCINE  04/07/2020    Colorectal cancer screening: Completed 07/06/2017. Repeat every 10 years Mammogram status: Ordered 05/06/2020. Pt provided with contact info and advised to call to schedule appt.  Bone Density status: Completed 05/22/2019. Results reflect: Bone density results: OSTEOPOROSIS. Repeat every 2 years.  Lung Cancer Screening: (Low Dose CT Chest recommended if Age 92-80 years, 30 pack-year currently smoking OR have quit w/in 15years.) does not qualify.   Additional Screening:  Hepatitis C Screening: does qualify; Completed  due  Vision Screening: Recommended annual ophthalmology exams for early detection of glaucoma and other disorders of the eye. Is the patient up to date with their annual eye exam?  Yes  Who is the provider or what is the name of the office in which the patient attends annual eye exams? [place] If pt is not established with a provider, would they like to be referred to a provider to establish care? No .   Dental Screening: Recommended annual dental exams for proper oral hygiene  Community Resource Referral / Chronic Care Management: CRR required this visit?  No   CCM required this visit?  No      Plan:     I have personally reviewed and noted the following in the patient's chart:   . Medical and social history . Use of alcohol, tobacco or illicit drugs  . Current medications and supplements . Functional ability and status . Nutritional status . Physical activity . Advanced directives . List of other physicians . Hospitalizations, surgeries, and ER visits in previous 12 months . Vitals . Screenings to include cognitive, depression, and falls . Referrals and appointments  In addition, I have reviewed and discussed with patient certain preventive protocols, quality metrics, and best practice recommendations. A written personalized care plan for preventive services as well as general preventive health recommendations were provided to patient.   Due to this being a telephonic visit, the after visit summary with patients personalized plan was offered to patient via mail or my-chart. Patient preferred to pick up at  office at next visit.   Andrez Grime, LPN   12/06/7125

## 2020-05-10 NOTE — Patient Instructions (Signed)
Christina Meadows , Thank you for taking time to come for your Medicare Wellness Visit. I appreciate your ongoing commitment to your health goals. Please review the following plan we discussed and let me know if I can assist you in the future.   Screening recommendations/referrals: Colonoscopy: Up to date, completed 07/06/2017, due 06/2027 Mammogram: ordered 05/06/2020, Patient will schedule appointment Bone Density: Up to date, completed 05/22/2019, due 05/2021 Recommended yearly ophthalmology/optometry visit for glaucoma screening and checkup Recommended yearly dental visit for hygiene and checkup  Vaccinations: Influenza vaccine: due, will get at later date  Pneumococcal vaccine: Completed series Tdap vaccine: decline- insurance/financial Shingles vaccine: due, check with your insurance regarding coverage   Covid-19:Completed series  Advanced directives: Advance directive discussed with you today. Even though you declined this today please call our office should you change your mind and we can give you the proper paperwork for you to fill out.  Conditions/risks identified: hypertension  Next appointment: Follow up in one year for your annual wellness visit    Preventive Care 68 Years and Older, Female Preventive care refers to lifestyle choices and visits with your health care provider that can promote health and wellness. What does preventive care include?  A yearly physical exam. This is also called an annual well check.  Dental exams once or twice a year.  Routine eye exams. Ask your health care provider how often you should have your eyes checked.  Personal lifestyle choices, including:  Daily care of your teeth and gums.  Regular physical activity.  Eating a healthy diet.  Avoiding tobacco and drug use.  Limiting alcohol use.  Practicing safe sex.  Taking low-dose aspirin every day.  Taking vitamin and mineral supplements as recommended by your health care  provider. What happens during an annual well check? The services and screenings done by your health care provider during your annual well check will depend on your age, overall health, lifestyle risk factors, and family history of disease. Counseling  Your health care provider may ask you questions about your:  Alcohol use.  Tobacco use.  Drug use.  Emotional well-being.  Home and relationship well-being.  Sexual activity.  Eating habits.  History of falls.  Memory and ability to understand (cognition).  Work and work Statistician.  Reproductive health. Screening  You may have the following tests or measurements:  Height, weight, and BMI.  Blood pressure.  Lipid and cholesterol levels. These may be checked every 5 years, or more frequently if you are over 14 years old.  Skin check.  Lung cancer screening. You may have this screening every year starting at age 89 if you have a 30-pack-year history of smoking and currently smoke or have quit within the past 15 years.  Fecal occult blood test (FOBT) of the stool. You may have this test every year starting at age 51.  Flexible sigmoidoscopy or colonoscopy. You may have a sigmoidoscopy every 5 years or a colonoscopy every 10 years starting at age 7.  Hepatitis C blood test.  Hepatitis B blood test.  Sexually transmitted disease (STD) testing.  Diabetes screening. This is done by checking your blood sugar (glucose) after you have not eaten for a while (fasting). You may have this done every 1-3 years.  Bone density scan. This is done to screen for osteoporosis. You may have this done starting at age 17.  Mammogram. This may be done every 1-2 years. Talk to your health care provider about how often you should have regular  mammograms. Talk with your health care provider about your test results, treatment options, and if necessary, the need for more tests. Vaccines  Your health care provider may recommend certain  vaccines, such as:  Influenza vaccine. This is recommended every year.  Tetanus, diphtheria, and acellular pertussis (Tdap, Td) vaccine. You may need a Td booster every 10 years.  Zoster vaccine. You may need this after age 45.  Pneumococcal 13-valent conjugate (PCV13) vaccine. One dose is recommended after age 52.  Pneumococcal polysaccharide (PPSV23) vaccine. One dose is recommended after age 35. Talk to your health care provider about which screenings and vaccines you need and how often you need them. This information is not intended to replace advice given to you by your health care provider. Make sure you discuss any questions you have with your health care provider. Document Released: 09/20/2015 Document Revised: 05/13/2016 Document Reviewed: 06/25/2015 Elsevier Interactive Patient Education  2017 [place] Prevention in the Home Falls can cause injuries. They can happen to people of all ages. There are many things you can do to make your home safe and to help prevent falls. What can I do on the outside of my home?  Regularly fix the edges of walkways and driveways and fix any cracks.  Remove anything that might make you trip as you walk through a door, such as a raised step or threshold.  Trim any bushes or trees on the path to your home.  Use bright outdoor lighting.  Clear any walking paths of anything that might make someone trip, such as rocks or tools.  Regularly check to see if handrails are loose or broken. Make sure that both sides of any steps have handrails.  Any raised decks and porches should have guardrails on the edges.  Have any leaves, snow, or ice cleared regularly.  Use sand or salt on walking paths during winter.  Clean up any spills in your garage right away. This includes oil or grease spills. What can I do in the bathroom?  Use night lights.  Install grab bars by the toilet and in the tub and shower. Do not use towel bars as grab  bars.  Use non-skid mats or decals in the tub or shower.  If you need to sit down in the shower, use a plastic, non-slip stool.  Keep the floor dry. Clean up any water that spills on the floor as soon as it happens.  Remove soap buildup in the tub or shower regularly.  Attach bath mats securely with double-sided non-slip rug tape.  Do not have throw rugs and other things on the floor that can make you trip. What can I do in the bedroom?  Use night lights.  Make sure that you have a light by your bed that is easy to reach.  Do not use any sheets or blankets that are too big for your bed. They should not hang down onto the floor.  Have a firm chair that has side arms. You can use this for support while you get dressed.  Do not have throw rugs and other things on the floor that can make you trip. What can I do in the kitchen?  Clean up any spills right away.  Avoid walking on wet floors.  Keep items that you use a lot in easy-to-reach places.  If you need to reach something above you, use a strong step stool that has a grab bar.  Keep electrical cords out of the way.  Do not use floor polish or wax that makes floors slippery. If you must use wax, use non-skid floor wax.  Do not have throw rugs and other things on the floor that can make you trip. What can I do with my stairs?  Do not leave any items on the stairs.  Make sure that there are handrails on both sides of the stairs and use them. Fix handrails that are broken or loose. Make sure that handrails are as long as the stairways.  Check any carpeting to make sure that it is firmly attached to the stairs. Fix any carpet that is loose or worn.  Avoid having throw rugs at the top or bottom of the stairs. If you do have throw rugs, attach them to the floor with carpet tape.  Make sure that you have a light switch at the top of the stairs and the bottom of the stairs. If you do not have them, ask someone to add them for  you. What else can I do to help prevent falls?  Wear shoes that:  Do not have high heels.  Have rubber bottoms.  Are comfortable and fit you well.  Are closed at the toe. Do not wear sandals.  If you use a stepladder:  Make sure that it is fully opened. Do not climb a closed stepladder.  Make sure that both sides of the stepladder are locked into place.  Ask someone to hold it for you, if possible.  Clearly mark and make sure that you can see:  Any grab bars or handrails.  First and last steps.  Where the edge of each step is.  Use tools that help you move around (mobility aids) if they are needed. These include:  Canes.  Walkers.  Scooters.  Crutches.  Turn on the lights when you go into a dark area. Replace any light bulbs as soon as they burn out.  Set up your furniture so you have a clear path. Avoid moving your furniture around.  If any of your floors are uneven, fix them.  If there are any pets around you, be aware of where they are.  Review your medicines with your doctor. Some medicines can make you feel dizzy. This can increase your chance of falling. Ask your doctor what other things that you can do to help prevent falls. This information is not intended to replace advice given to you by your health care provider. Make sure you discuss any questions you have with your health care provider. Document Released: 06/20/2009 Document Revised: 01/30/2016 Document Reviewed: 09/28/2014 Elsevier Interactive Patient Education  2017 [employer].

## 2020-05-10 NOTE — Progress Notes (Signed)
PCP notes:  Health Maintenance: Flu- due Tdap- insurance/financial    Abnormal Screenings: none   Patient concerns: none   Nurse concerns: none   Next PCP appt.: none

## 2020-05-14 ENCOUNTER — Other Ambulatory Visit: Payer: Self-pay

## 2020-05-14 ENCOUNTER — Emergency Department: Payer: Medicare Other

## 2020-05-14 ENCOUNTER — Encounter: Payer: Self-pay | Admitting: *Deleted

## 2020-05-14 DIAGNOSIS — I10 Essential (primary) hypertension: Secondary | ICD-10-CM | POA: Insufficient documentation

## 2020-05-14 DIAGNOSIS — M1990 Unspecified osteoarthritis, unspecified site: Secondary | ICD-10-CM | POA: Insufficient documentation

## 2020-05-14 DIAGNOSIS — G459 Transient cerebral ischemic attack, unspecified: Secondary | ICD-10-CM | POA: Diagnosis not present

## 2020-05-14 DIAGNOSIS — Z20822 Contact with and (suspected) exposure to covid-19: Secondary | ICD-10-CM | POA: Diagnosis not present

## 2020-05-14 DIAGNOSIS — Z79899 Other long term (current) drug therapy: Secondary | ICD-10-CM | POA: Insufficient documentation

## 2020-05-14 DIAGNOSIS — Z7982 Long term (current) use of aspirin: Secondary | ICD-10-CM | POA: Insufficient documentation

## 2020-05-14 DIAGNOSIS — R479 Unspecified speech disturbances: Secondary | ICD-10-CM | POA: Diagnosis present

## 2020-05-14 DIAGNOSIS — G4733 Obstructive sleep apnea (adult) (pediatric): Secondary | ICD-10-CM | POA: Insufficient documentation

## 2020-05-14 DIAGNOSIS — H539 Unspecified visual disturbance: Secondary | ICD-10-CM | POA: Diagnosis not present

## 2020-05-14 DIAGNOSIS — G458 Other transient cerebral ischemic attacks and related syndromes: Secondary | ICD-10-CM | POA: Diagnosis not present

## 2020-05-14 DIAGNOSIS — H542X11 Low vision right eye category 1, low vision left eye category 1: Secondary | ICD-10-CM | POA: Diagnosis not present

## 2020-05-14 DIAGNOSIS — R4182 Altered mental status, unspecified: Secondary | ICD-10-CM | POA: Diagnosis not present

## 2020-05-14 DIAGNOSIS — E785 Hyperlipidemia, unspecified: Secondary | ICD-10-CM | POA: Diagnosis not present

## 2020-05-14 DIAGNOSIS — R4781 Slurred speech: Secondary | ICD-10-CM | POA: Diagnosis not present

## 2020-05-14 DIAGNOSIS — M6281 Muscle weakness (generalized): Secondary | ICD-10-CM | POA: Diagnosis not present

## 2020-05-14 LAB — BASIC METABOLIC PANEL
Anion gap: 10 (ref 5–15)
BUN: 10 mg/dL (ref 8–23)
CO2: 28 mmol/L (ref 22–32)
Calcium: 9.3 mg/dL (ref 8.9–10.3)
Chloride: 102 mmol/L (ref 98–111)
Creatinine, Ser: 0.64 mg/dL (ref 0.44–1.00)
GFR calc Af Amer: >60 mL/min (ref >60)
GFR calc non Af Amer: >60 mL/min (ref >60)
Glucose, Bld: 82 mg/dL (ref 70–99)
Potassium: 3.6 mmol/L (ref 3.5–5.1)
Sodium: 140 mmol/L (ref 135–145)

## 2020-05-14 LAB — CBC
HCT: 44.4 % (ref 36.0–46.0)
Hemoglobin: 15 g/dL (ref 12.0–15.0)
MCH: 31.3 pg (ref 26.0–34.0)
MCHC: 33.8 g/dL (ref 30.0–36.0)
MCV: 92.5 fL (ref 80.0–100.0)
Platelets: 202 10*3/uL (ref 150–400)
RBC: 4.8 MIL/uL (ref 3.87–5.11)
RDW: 13.2 % (ref 11.5–15.5)
WBC: 9.8 10*3/uL (ref 4.0–10.5)
nRBC: 0 % (ref 0.0–0.2)

## 2020-05-14 LAB — TROPONIN I (HIGH SENSITIVITY)
Troponin I (High Sensitivity): 3 ng/L (ref <18)
Troponin I (High Sensitivity): 5 ng/L (ref <18)

## 2020-05-14 NOTE — ED Triage Notes (Addendum)
Pt to triage via wheelchair.  Pt sent to ER for eval of slurred speech and h/a.  Pt was seen at [facility] today.  Pt has vision changes. Pt also reports difficulty concentrating.  Sx for 4 days  Pt alert.

## 2020-05-14 NOTE — ED Triage Notes (Signed)
FIRST NURSE NOTE:  Pt arrived via POV from [facility] with reports of slurred speech, HA, visual los and L arm weakness. Per pt the L arm weakness started 3 weeks ago.   Pt reports on Friday she started having vision changes where she was seeing stars shooting across her vision. Pt also states she started having a HA then.  Pt also reports having slurred speech and difficulty gathering her thoughts, pt does not know when sxs started, but states that when she woke up this morning she was having balance issues.    Pt is alert and oriented at this time, no noticeable slurred speech at this time.

## 2020-05-15 ENCOUNTER — Observation Stay
Admission: EM | Admit: 2020-05-15 | Discharge: 2020-05-16 | Disposition: A | Discharge status: Home / Self Care | Payer: Medicare Other | Attending: Internal Medicine | Admitting: Internal Medicine

## 2020-05-15 ENCOUNTER — Observation Stay (HOSPITAL_BASED_OUTPATIENT_CLINIC_OR_DEPARTMENT_OTHER)
Admit: 2020-05-15 | Discharge: 2020-05-15 | Disposition: A | Discharge status: Home / Self Care | Payer: Medicare Other | Attending: Internal Medicine | Admitting: Internal Medicine

## 2020-05-15 ENCOUNTER — Observation Stay: Payer: Medicare Other

## 2020-05-15 DIAGNOSIS — I361 Nonrheumatic tricuspid (valve) insufficiency: Secondary | ICD-10-CM | POA: Diagnosis not present

## 2020-05-15 DIAGNOSIS — R519 Headache, unspecified: Secondary | ICD-10-CM | POA: Diagnosis not present

## 2020-05-15 DIAGNOSIS — Z9989 Dependence on other enabling machines and devices: Secondary | ICD-10-CM

## 2020-05-15 DIAGNOSIS — E785 Hyperlipidemia, unspecified: Secondary | ICD-10-CM | POA: Diagnosis present

## 2020-05-15 DIAGNOSIS — G4733 Obstructive sleep apnea (adult) (pediatric): Secondary | ICD-10-CM | POA: Diagnosis not present

## 2020-05-15 DIAGNOSIS — G459 Transient cerebral ischemic attack, unspecified: Secondary | ICD-10-CM | POA: Diagnosis present

## 2020-05-15 DIAGNOSIS — R4781 Slurred speech: Secondary | ICD-10-CM | POA: Diagnosis not present

## 2020-05-15 DIAGNOSIS — I10 Essential (primary) hypertension: Secondary | ICD-10-CM | POA: Diagnosis not present

## 2020-05-15 DIAGNOSIS — H539 Unspecified visual disturbance: Secondary | ICD-10-CM

## 2020-05-15 DIAGNOSIS — I639 Cerebral infarction, unspecified: Secondary | ICD-10-CM

## 2020-05-15 DIAGNOSIS — R29898 Other symptoms and signs involving the musculoskeletal system: Secondary | ICD-10-CM

## 2020-05-15 LAB — SARS CORONAVIRUS 2 BY RT PCR (HOSPITAL ORDER, PERFORMED IN ~~LOC~~ HOSPITAL LAB): SARS Coronavirus 2: NEGATIVE

## 2020-05-15 MED ORDER — ACETAMINOPHEN 160 MG/5ML PO SOLN
650.0000 mg | ORAL | Status: DC | PRN
  Filled 2020-05-15: qty 20.3

## 2020-05-15 MED ORDER — FLUTICASONE PROPIONATE 50 MCG/ACT NA SUSP
2.0000 | Freq: Every day | NASAL | Status: DC
  Filled 2020-05-15: qty 16

## 2020-05-15 MED ORDER — ASPIRIN 325 MG PO TABS
325.0000 mg | ORAL_TABLET | Freq: Every day | ORAL | Status: DC
  Administered 2020-05-15 – 2020-05-16 (×2): 325 mg via ORAL
  Filled 2020-05-15 (×2): qty 1

## 2020-05-15 MED ORDER — CITALOPRAM HYDROBROMIDE 20 MG PO TABS
40.0000 mg | ORAL_TABLET | Freq: Every day | ORAL | Status: DC
  Administered 2020-05-16: 40 mg via ORAL
  Filled 2020-05-15: qty 2

## 2020-05-15 MED ORDER — MONTELUKAST SODIUM 10 MG PO TABS
10.0000 mg | ORAL_TABLET | Freq: Every day | ORAL | Status: DC
  Administered 2020-05-15: 10 mg via ORAL
  Filled 2020-05-15: qty 1

## 2020-05-15 MED ORDER — ATORVASTATIN CALCIUM 20 MG PO TABS
40.0000 mg | ORAL_TABLET | Freq: Every day | ORAL | Status: DC
  Administered 2020-05-15 – 2020-05-16 (×2): 40 mg via ORAL
  Filled 2020-05-15 (×2): qty 2

## 2020-05-15 MED ORDER — GADOBUTROL 1 MMOL/ML IV SOLN
7.0000 mL | Freq: Once | INTRAVENOUS | Status: AC | PRN
  Administered 2020-05-15: 7 mL via INTRAVENOUS

## 2020-05-15 MED ORDER — CALCIUM CARBONATE-VITAMIN D 500-200 MG-UNIT PO TABS
1.0000 | ORAL_TABLET | Freq: Every day | ORAL | Status: DC
  Administered 2020-05-16: 09:00:00 1 via ORAL
  Filled 2020-05-15: qty 1

## 2020-05-15 MED ORDER — ACETAMINOPHEN 325 MG PO TABS
650.0000 mg | ORAL_TABLET | ORAL | Status: DC | PRN
  Administered 2020-05-15: 650 mg via ORAL
  Filled 2020-05-15: qty 2

## 2020-05-15 MED ORDER — HYDROCHLOROTHIAZIDE 25 MG PO TABS
25.0000 mg | ORAL_TABLET | Freq: Every day | ORAL | Status: DC
  Administered 2020-05-15 – 2020-05-16 (×2): 25 mg via ORAL
  Filled 2020-05-15 (×2): qty 1

## 2020-05-15 MED ORDER — ACETAMINOPHEN 650 MG RE SUPP: 650.0000 mg | RECTAL | Status: DC | PRN

## 2020-05-15 MED ORDER — LORATADINE 10 MG PO TABS
10.0000 mg | ORAL_TABLET | Freq: Every day | ORAL | Status: DC
  Filled 2020-05-15: qty 1

## 2020-05-15 MED ORDER — ASPIRIN 300 MG RE SUPP
300.0000 mg | Freq: Every day | RECTAL | Status: DC
  Filled 2020-05-15: qty 1

## 2020-05-15 MED ORDER — SENNOSIDES-DOCUSATE SODIUM 8.6-50 MG PO TABS: 1.0000 | ORAL_TABLET | Freq: Every evening | ORAL | Status: DC | PRN

## 2020-05-15 MED ORDER — VITAMIN D 25 MCG (1000 UNIT) PO TABS
1000.0000 [IU] | ORAL_TABLET | Freq: Every day | ORAL | Status: DC
  Administered 2020-05-16: 1000 [IU] via ORAL
  Filled 2020-05-15: qty 1

## 2020-05-15 MED ORDER — LISINOPRIL-HYDROCHLOROTHIAZIDE 20-25 MG PO TABS: 1.0000 | ORAL_TABLET | Freq: Every day | ORAL | Status: DC

## 2020-05-15 MED ORDER — HYDRALAZINE HCL 20 MG/ML IJ SOLN: 5.0000 mg | INTRAMUSCULAR | Status: DC | PRN

## 2020-05-15 MED ORDER — LISINOPRIL 20 MG PO TABS
20.0000 mg | ORAL_TABLET | Freq: Every day | ORAL | Status: DC
  Administered 2020-05-15 – 2020-05-16 (×2): 20 mg via ORAL
  Filled 2020-05-15 (×2): qty 1

## 2020-05-15 MED ORDER — ONDANSETRON HCL 4 MG/2ML IJ SOLN: 4.0000 mg | Freq: Three times a day (TID) | INTRAMUSCULAR | Status: DC | PRN

## 2020-05-15 MED ORDER — STROKE: EARLY STAGES OF RECOVERY BOOK: Freq: Once | Status: AC

## 2020-05-15 MED ORDER — ENOXAPARIN SODIUM 40 MG/0.4ML ~~LOC~~ SOLN
40.0000 mg | SUBCUTANEOUS | Status: DC
  Administered 2020-05-15: 40 mg via SUBCUTANEOUS
  Filled 2020-05-15: qty 0.4

## 2020-05-15 NOTE — ED Notes (Signed)
This RN to bedside, pt given a drink per her request. This RN apologized for and explained delay at this time. Pt states understanding. Pt visualized in NAD at this time. Denies further needs.

## 2020-05-15 NOTE — Progress Notes (Addendum)
   05/15/20 1840  Clinical Encounter Type  Visited With Patient and family together  Visit Type Initial  Referral From Nurse  Consult/Referral To Chaplain  Chaplain responded to OR for AD. When chaplain arrived patient was hanging up from a telephone call and her daughter was at the bedside. Chaplain explained AD form must be completed and signe by pt in front of two witnesses and a notary. Chaplain asked if they had questions and they said no. Chaplain told them to have a chaplain paged when pt is ready to complete the form.

## 2020-05-15 NOTE — ED Notes (Signed)
This RN and EDP at bedside.

## 2020-05-15 NOTE — Consult Note (Addendum)
NEURO HOSPITALIST CONSULT NOTE   Requestig physician: Dr. Blaine Hamper  Reason for Consult: Vision changes, slurred speech and mild confusion  History obtained from:  Patient and Chart     HPI:                                                                                                                                          Christina Meadows is an 69 y.o. female with a PMHx of arthritis, HLD, HTN and OSA (on CPAP) who presented to the ED in a wheelchair yesterday with slurred speech, mild confusion, vertigo, headache and vision changes and LUE weakness. Per patient the left arm weakness started 3 weeks ago. On arrival to the ED, she was alert and oriented without noticeable dysarthria.   She gives somewhat different accounts of time of symptom onset to different providers. She endorsed that the above symptoms began intermittently about 5 days PTA and have worsened since.   Regarding her vision, her symptoms started on Friday with "seeing stars" shooting across her visual fields, in conjunction with a left frontal throbbing headache, which is currently rated at 4/10. This is occurring only in her left eye, involving the upper temporal visual field and is generally only noticeable in a dark environment. The left eye symptoms also include decreased visual acuity in normal ambient light and a sensation of fullness/pressure in the eye.   On awakening this AM she had balance issues. She is not sure when her slurred speech and mild confusion started.   She was seen at [facility] yesterday. Per her daughter, who works there, the formal visual fields testing revealed a tunnel-vision pattern in her left eye and did not fit with an occipital stroke per the Ophthalmologist who reviewed the result - the Ophthalmologist told her that the perimetry results did not fit any clear anatomical pattern of visual loss.   She has no prior history of stroke or MI. She also denies a history of  migraine headache. She is not on an antiplatelet medication at home, but does take a combination antihypertensive (Zestoretic).   Past Medical History:  Diagnosis Date  . Arthritis   . Hyperlipidemia   . Hypertension   . OSA on CPAP 2019    Past Surgical History:  Procedure Laterality Date  . ABDOMINAL HYSTERECTOMY  2000  . COLONOSCOPY    . COLONOSCOPY WITH PROPOFOL N/A 07/06/2017   Procedure: COLONOSCOPY WITH PROPOFOL;  Surgeon: Robert Bellow, MD;  Location: [facility] ENDOSCOPY;  Service: Endoscopy;  Laterality: N/A;  . KNEE SURGERY Left 11/2017  . KNEE SURGERY Right 02/2018  . UPPER GI ENDOSCOPY      Family History  Problem Relation Age of Onset  . Lung cancer Mother 87  . Diabetes Father   .  COPD Father   . Arthritis Father   . Colon cancer Brother 51  . Arthritis Brother   . Diabetes Brother   . Early death Brother   . Hearing loss Brother   . Heart disease Brother   . Hyperlipidemia Brother   . Hypertension Brother   . Throat cancer Maternal Grandmother               Social History:  reports that she quit smoking about 41 years ago. Her smoking use included cigarettes. She has never used smokeless tobacco. She reports current alcohol use. She reports that she does not use drugs.  No Known Allergies  HOME MEDICATIONS:                                                                                                                      No current facility-administered medications on file prior to encounter.   Current Outpatient Medications on File Prior to Encounter  Medication Sig Dispense Refill  . acetaminophen (TYLENOL) 500 MG tablet Take 1,000 mg by mouth daily. Taking as needed    . CALCIUM+D3 GRADUAL RELEASE 600-40-500 MG-MG-UNIT TB24 Take by mouth.    . citalopram (CELEXA) 40 MG tablet Take 1 tablet (40 mg total) by mouth daily. 90 tablet 3  . lisinopril-hydrochlorothiazide (ZESTORETIC) 20-25 MG tablet Take 1 tablet by mouth daily. 90 tablet 3  .  cetirizine (ZYRTEC) 10 MG tablet Take by mouth.    . cholecalciferol (VITAMIN D3) 25 MCG (1000 UNIT) tablet Take 1,000 Units by mouth daily.    . fluticasone (FLONASE) 50 MCG/ACT nasal spray Place 2 sprays into both nostrils daily. 16 g 5  . montelukast (SINGULAIR) 10 MG tablet Take 1 tablet (10 mg total) by mouth at bedtime. 90 tablet 3   Inpatient Medications: . aspirin  300 mg Rectal Daily   Or  . aspirin  325 mg Oral Daily  . atorvastatin  40 mg Oral Daily  . calcium-vitamin D  1 tablet Oral Daily  . [START ON 05/16/2020] cholecalciferol  1,000 Units Oral Daily  . [START ON 05/16/2020] citalopram  40 mg Oral Daily  . enoxaparin (LOVENOX) injection  40 mg Subcutaneous Q24H  . [START ON 05/16/2020] fluticasone  2 spray Each Nare Daily  . lisinopril  20 mg Oral Daily   And  . hydrochlorothiazide  25 mg Oral Daily  . [START ON 05/16/2020] loratadine  10 mg Oral Daily  . montelukast  10 mg Oral QHS     ROS:  Denies SOB, CP, fever/chills, cough, abdominal pain, dysuria, N/V. Other ROS as per HPI. No additional symptoms are endorsed by the patient.    Blood pressure 126/61, pulse (!) 59, temperature 99 F (37.2 C), temperature source Oral, resp. rate 15, height 4\' 11"  (1.499 m), weight 76.7 kg, SpO2 98 %.   General Examination:                                                                                                       Physical Exam  HEENT-  /AT    Lungs-Respirations unlabored.   Extremities- No edema  Neurological Examination Mental Status: Awake and alert. Pleasant and cooperative. Speech is fluent without evidence for aphasia. Able to follow all commands and answer complex questions. Oriented x 5.  Cranial Nerves: II: Visual fields intact in all 4 quadrants of each eye tested individually. Tracks and fixates normally. No visual extinction to  DSS. PERRL. Visual acuity in low level ambient light is 20/200 OS and 20/100 OD.  III,IV, VI: Left ptosis with a component of volitional squinting. EOMI without nystagmus.  V,VII: Smile symmetric, facial temp sensation equal bilaterally VIII: hearing intact to voice IX,X: Palate rises symmetrically XI: Symmetric shoulder shrug XII: Midline tongue extension Motor: Right : Upper extremity   5/5    Left:     Upper extremity   5/5  Lower extremity   5/5     Lower extremity   5/5 Normal tone throughout; no atrophy noted Sensory: Temp and light touch intact throughout, bilaterally Deep Tendon Reflexes: 2+ and symmetric throughout Plantars: Right: downgoing   Left: downgoing Cerebellar: No ataxia with FNF bilaterally  Gait: Deferred   Lab Results: Basic Metabolic Panel: Recent Labs  Lab 05/14/20 1622  NA 140  K 3.6  CL 102  CO2 28  GLUCOSE 82  BUN 10  CREATININE 0.64  CALCIUM 9.3    CBC: Recent Labs  Lab 05/14/20 1622  WBC 9.8  HGB 15.0  HCT 44.4  MCV 92.5  PLT 202    Cardiac Enzymes: No results for input(s): CKTOTAL, CKMB, CKMBINDEX, TROPONINI in the last 168 hours.  Lipid Panel: No results for input(s): CHOL, TRIG, HDL, CHOLHDL, VLDL, LDLCALC in the last 168 hours.  Imaging: DG Chest 2 View  Result Date: 05/14/2020 CLINICAL DATA:  TIA symptoms.  Slurred speech. EXAM: CHEST - 2 VIEW COMPARISON:  Radiograph 05/06/2020 FINDINGS: The cardiomediastinal contours are normal. Atherosclerosis of the thoracic aorta. Pulmonary vasculature is normal. No consolidation, pleural effusion, or pneumothorax. No acute osseous abnormalities are seen. IMPRESSION: No acute chest findings. Electronically Signed   By: Keith Rake M.D.   On: 05/14/2020 17:26   CT Head Wo Contrast  Result Date: 05/14/2020 CLINICAL DATA:  Mental status change EXAM: CT HEAD WITHOUT CONTRAST TECHNIQUE: Contiguous axial images were obtained from the base of the skull through the vertex without intravenous  contrast. COMPARISON:  None. FINDINGS: Brain: No acute territorial infarction, hemorrhage or intracranial mass. Ventricles are nonenlarged. Chronic lacunar infarct or dilated perivascular space in the left basal ganglia. Vascular: No hyperdense  vessels.  Carotid vascular calcification Skull: Normal. Negative for fracture or focal lesion. Sinuses/Orbits: No acute finding. Other: None IMPRESSION: No CT evidence for acute intracranial abnormality. Electronically Signed   By: Donavan Foil M.D.   On: 05/14/2020 17:19    Assessment: 69 year old female presenting with slurred speech, mild confusion, vertigo, headache and vision changes and LUE weakness. Per patient the left arm weakness started 3 weeks ago. On arrival to the ED, she was alert and oriented without noticeable dysarthria.  1. Neurological exam is nonfocal. However, there is bilaterally decreased visual acuity.  2. CT head shows no acute abnormality.  3. DDx for presentation includes complicated migraine (first occurrence), atypical stroke and demyelinating disease  Recommendations: 1. MRI brain with and without contrast.  2. MRA of head 3. Echocardiogram has been completed with results pending.  4. Carotid ultrasound 5. Agree with starting ASA and atorvastatin 6. Permissive HTN x 24 hours 7. PT/OT/Speech 8. HgbA1c, fasting lipid panel 9. Telemetry monitoring 10. Frequent neuro checks  Addendum: MRI brain and MRA head completed. Report conclusions:  No evidence of recent infarction, hemorrhage, or mass. Mild chronic microvascular ischemic changes. MRA shows no proximal intracranial vessel occlusion or significant stenosis.   Electronically signed: Dr. Kerney Elbe 05/15/2020, 2:44 PM

## 2020-05-15 NOTE — ED Provider Notes (Signed)
[facility] Emergency Department Provider Note   ____________________________________________   First MD Initiated Contact with Patient 05/15/20 1056     (approximate)  I have reviewed the triage vital signs and the nursing notes.   HISTORY  Chief Complaint Transient Ischemic Attack    HPI Christina Meadows is a 69 y.o. female with a stated past medical history of hypertension and hypercholesterolemia as well as significant family history of CVA and an older brother who presents for multiple intermittent symptoms including vision changes, vertigo, left-sided weakness, and slurred speech.  Patient states that these symptoms began intermittently approximately 5 days prior to arrival and have been worsening since onset in their intensity.  Patient denies any exacerbating or relieving factors.  Patient denies ever having symptoms similar to this in the past.  Patient describes these episodes lasting anywhere from 5-30 minutes and resolving spontaneously.         Past Medical History:  Diagnosis Date  . Arthritis   . Hyperlipidemia   . Hypertension   . OSA on CPAP 2019    Patient Active Problem List   Diagnosis Date Noted  . Osteoporosis 05/24/2019  . OSA on CPAP 05/04/2019  . Status post total right knee replacement 12/15/2017  . Welcome to Medicare preventive visit 12/03/2017  . Anxiety 11/19/2017  . Essential hypertension 11/19/2017  . Palpitations 11/10/2017  . Sleep concern 11/10/2017  . Bilateral primary osteoarthritis of knee 07/16/2017  . Encounter for screening colonoscopy 06/07/2017  . Family history of colon cancer 06/07/2017    Past Surgical History:  Procedure Laterality Date  . ABDOMINAL HYSTERECTOMY  2000  . COLONOSCOPY    . COLONOSCOPY WITH PROPOFOL N/A 07/06/2017   Procedure: COLONOSCOPY WITH PROPOFOL;  Surgeon: Robert Bellow, MD;  Location: [facility] ENDOSCOPY;  Service: Endoscopy;  Laterality: N/A;  . KNEE SURGERY Left  11/2017  . KNEE SURGERY Right 02/2018  . UPPER GI ENDOSCOPY      Prior to Admission medications   Medication Sig Start Date End Date Taking? Authorizing Provider  acetaminophen (TYLENOL) 500 MG tablet Take 1,000 mg by mouth daily. Taking as needed    [provider]  cetirizine (ZYRTEC) 10 MG tablet Take by mouth.    [provider]  cholecalciferol (VITAMIN D3) 25 MCG (1000 UNIT) tablet Take 1,000 Units by mouth daily.    [provider]  citalopram (CELEXA) 40 MG tablet Take 1 tablet (40 mg total) by mouth daily. 05/06/20   Elby Beck, FNP  fluticasone (FLONASE) 50 MCG/ACT nasal spray Place 2 sprays into both nostrils daily. 12/14/18   Elby Beck, FNP  lisinopril-hydrochlorothiazide (ZESTORETIC) 20-25 MG tablet Take 1 tablet by mouth daily. 11/01/19   Elby Beck, FNP  montelukast (SINGULAIR) 10 MG tablet Take 1 tablet (10 mg total) by mouth at bedtime. 11/01/19   Elby Beck, FNP  UNABLE TO FIND Med Name: CPAP (pressure 11)    [provider]    Allergies Patient has no known allergies.  Family History  Problem Relation Age of Onset  . Lung cancer Mother 32  . Diabetes Father   . COPD Father   . Arthritis Father   . Colon cancer Brother 7  . Arthritis Brother   . Diabetes Brother   . Early death Brother   . Hearing loss Brother   . Heart disease Brother   . Hyperlipidemia Brother   . Hypertension Brother   . Throat cancer Maternal Grandmother  Social History Social History   Tobacco Use  . Smoking status: Former Smoker    Types: Cigarettes    Quit date: 09/07/1978    Years since quitting: 41.7  . Smokeless tobacco: Never Used  Vaping Use  . Vaping Use: Never used  Substance Use Topics  . Alcohol use: Yes    Comment: occasional  . Drug use: No    Review of Systems Constitutional: No fever/chills Eyes: No visual changes. ENT: No sore throat. Cardiovascular: Denies chest pain. Respiratory: Denies  shortness of breath. Gastrointestinal: No abdominal pain.  No nausea, no vomiting.  No diarrhea. Genitourinary: Negative for dysuria. Musculoskeletal: Negative for acute arthralgias Skin: Negative for rash. Neurological: Negative for headaches, weakness/numbness/paresthesias in any extremity Psychiatric: Negative for suicidal ideation/homicidal ideation   ____________________________________________   PHYSICAL EXAM:  VITAL SIGNS: ED Triage Vitals  Enc Vitals Group     BP 05/14/20 1621 (!) 145/60     Pulse Rate 05/14/20 1621 (!) 54     Resp 05/14/20 1621 16     Temp 05/14/20 1621 98 F (36.7 C)     Temp Source 05/14/20 1621 Oral     SpO2 05/14/20 1621 99 %     Weight 05/14/20 1621 169 lb (76.7 kg)     Height 05/14/20 1621 4\' 11"  (1.499 m)     Head Circumference --      Peak Flow --      Pain Score 05/14/20 1645 5     Pain Loc --      Pain Edu? --      Excl. in [place]? --     Constitutional: Alert and oriented. Well appearing and in no acute distress. Eyes: Conjunctivae are normal. PERRL. EOMI. Head: Atraumatic. Nose: No congestion/rhinnorhea. Mouth/Throat: Mucous membranes are moist.  Oropharynx non-erythematous. Neck: No stridor.   Cardiovascular: Normal rate, regular rhythm. Grossly normal heart sounds.  Good peripheral circulation. Respiratory: Normal respiratory effort.  No retractions. Lungs CTAB. Gastrointestinal: Soft and nontender. No distention. No abdominal bruits. No CVA tenderness. Musculoskeletal: No lower extremity tenderness nor edema.  No joint effusions. Neurologic: + Slurred speech and language. No gross focal neurologic deficits are appreciated. No gait instability. Skin:  Skin is warm, dry and intact. No rash noted. Psychiatric: Mood and affect are normal. Speech and behavior are normal.  ____________________________________________   LABS (all labs ordered are listed, but only abnormal results are displayed)  Labs Reviewed  SARS CORONAVIRUS 2 BY  RT PCR ([facility])  BASIC METABOLIC PANEL  CBC  TROPONIN I (HIGH SENSITIVITY)  TROPONIN I (HIGH SENSITIVITY)   ____________________________________________  EKG  ED ECG REPORT I, Naaman Plummer, the attending physician, personally viewed and interpreted this ECG.  Date: 05/15/2020 EKG Time: 1624 Rate: 52 Rhythm: normal sinus rhythm QRS Axis: normal Intervals: normal ST/T Wave abnormalities: normal Narrative Interpretation: no evidence of acute ischemia  ____________________________________________  RADIOLOGY  ED MD interpretation: 2 view chest x-ray shows no evidence of acute abnormalities CT noncontrast of the head shows there is no evidence of acute intracranial abnormalities  Official radiology report(s): DG Chest 2 View  Result Date: 05/14/2020 CLINICAL DATA:  TIA symptoms.  Slurred speech. EXAM: CHEST - 2 VIEW COMPARISON:  Radiograph 05/06/2020 FINDINGS: The cardiomediastinal contours are normal. Atherosclerosis of the thoracic aorta. Pulmonary vasculature is normal. No consolidation, pleural effusion, or pneumothorax. No acute osseous abnormalities are seen. IMPRESSION: No acute chest findings. Electronically Signed   By: Keith Rake  M.D.   On: 05/14/2020 17:26   CT Head Wo Contrast  Result Date: 05/14/2020 CLINICAL DATA:  Mental status change EXAM: CT HEAD WITHOUT CONTRAST TECHNIQUE: Contiguous axial images were obtained from the base of the skull through the vertex without intravenous contrast. COMPARISON:  None. FINDINGS: Brain: No acute territorial infarction, hemorrhage or intracranial mass. Ventricles are nonenlarged. Chronic lacunar infarct or dilated perivascular space in the left basal ganglia. Vascular: No hyperdense vessels.  Carotid vascular calcification Skull: Normal. Negative for fracture or focal lesion. Sinuses/Orbits: No acute finding. Other: None IMPRESSION: No CT evidence for acute intracranial abnormality.  Electronically Signed   By: Donavan Foil M.D.   On: 05/14/2020 17:19    ____________________________________________   PROCEDURES  Procedure(s) performed (including Critical Care):  Procedures   ____________________________________________   INITIAL IMPRESSION / ASSESSMENT AND PLAN / ED COURSE   Patient is a 69 year old female who presents with symptoms concerning for TIA PMH risk factors: Hypertension, hyperlipidemia Neurologic Deficits: Vision changes, slurred speech, left-sided weakness Last known Well Time: 4 days prior to arrival NIH Stroke Score: 0 Given History and Exam I have lower suspicion for infectious etiology, neurologic changes secondary to toxicologic ingestion, seizure, complex migraine. Presentation concerning for possible stroke requiring workup.  Workup: Labs: POC glucose, CBC, BMP, LFTs, Troponin, PT/INR, PTT, Type and Screen Other Diagnostics: ECG, CXR, non-contrast head CT followed by CTA brain and neck (to r/o large vessel occlusion amenable to thrombectomy) Interventions: Patient not eligible for TPA due to resolution of symptoms prior to evaluation  Consult: hospitalist Disposition: Admit      ____________________________________________   FINAL CLINICAL IMPRESSION(S) / ED DIAGNOSES  Final diagnoses:  None     ED Discharge Orders    None       Note:  This document was prepared using Dragon voice recognition software and may include unintentional dictation errors.   Naaman Plummer, MD 05/15/20 [phone]

## 2020-05-15 NOTE — H&P (Signed)
History and Physical    Christina Meadows EUM:[id number] DOB: July 24, 1951 DOA: 05/15/2020  Referring MD/NP/PA:   PCP: Elby Beck, FNP   Patient coming from:  The patient is coming from home.  At baseline, pt is independent for most of ADL.        Chief Complaint: Slurred speech, left arm weakness, vision loss, headache, poor balance  HPI: Christina Meadows is a 69 y.o. female with medical history significant of hypertension, hyperlipidemia, depression, anxiety, OSA on CPAP, arthritis, who presents with slurred speech, left arm weakness, vision loss, headache, poor balance.  Patient states that he has intermittent left arm weakness in the past 3 weeks.  In the past 5 days, patient has been having intermittent left eye vision loss, headache, poor balance, dizziness, slurred speech and mild confusion.  His symptoms have resolved currently.  Patient was seen by ophthalmologist yesterday, and found to have floaters in left eye.  Patient denies chest pain, shortness breath, cough, fever or chills.  No nausea vomiting, diarrhea, abdominal pain, symptoms of UTI.  ED Course: pt was found to have WBC 9.8, troponin level 5, negative Covid PCR, electrolytes renal function okay, temperature 99, blood pressure 135/55, heart rate 50-63, RR 15, oxygen saturation 97% on room air.  Chest x-ray negative.  CT of the head is negative for acute intracranial abnormalities.  Patient is placed on MedSurg bed for observation.  Neurology, Dr. Cheral Marker is consulted.  Review of Systems:   General: no fevers, chills, no body weight gain, has fatigue HEENT:  no hearing changes or sore throat. Has left eye vision loss. Respiratory: no dyspnea, coughing, wheezing CV: no chest pain, no palpitations GI: no nausea, vomiting, abdominal pain, diarrhea, constipation GU: no dysuria, burning on urination, increased urinary frequency, hematuria  Ext: no leg edema Neuro: has slurred speech, left arm weakness, vision loss,  headache, poor balance Skin: no rash, no skin tear. MSK: No muscle spasm, no deformity, no limitation of range of movement in spin Heme: No easy bruising.  Travel history: No recent long distant travel.  Allergy: No Known Allergies  Past Medical History:  Diagnosis Date  . Arthritis   . Hyperlipidemia   . Hypertension   . OSA on CPAP 2019    Past Surgical History:  Procedure Laterality Date  . ABDOMINAL HYSTERECTOMY  2000  . COLONOSCOPY    . COLONOSCOPY WITH PROPOFOL N/A 07/06/2017   Procedure: COLONOSCOPY WITH PROPOFOL;  Surgeon: Robert Bellow, MD;  Location: [facility] ENDOSCOPY;  Service: Endoscopy;  Laterality: N/A;  . KNEE SURGERY Left 11/2017  . KNEE SURGERY Right 02/2018  . UPPER GI ENDOSCOPY      Social History:  reports that she quit smoking about 41 years ago. Her smoking use included cigarettes. She has never used smokeless tobacco. She reports current alcohol use. She reports that she does not use drugs.  Family History:  Family History  Problem Relation Age of Onset  . Lung cancer Mother 51  . Diabetes Father   . COPD Father   . Arthritis Father   . Colon cancer Brother 52  . Arthritis Brother   . Diabetes Brother   . Early death Brother   . Hearing loss Brother   . Heart disease Brother   . Hyperlipidemia Brother   . Hypertension Brother   . Throat cancer Maternal Grandmother      Prior to Admission medications   Medication Sig Start Date End Date Taking? Authorizing Provider  acetaminophen (TYLENOL)  500 MG tablet Take 1,000 mg by mouth daily. Taking as needed    [provider]  cetirizine (ZYRTEC) 10 MG tablet Take by mouth.    [provider]  cholecalciferol (VITAMIN D3) 25 MCG (1000 UNIT) tablet Take 1,000 Units by mouth daily.    [provider]  citalopram (CELEXA) 40 MG tablet Take 1 tablet (40 mg total) by mouth daily. 05/06/20   Elby Beck, FNP  fluticasone (FLONASE) 50 MCG/ACT nasal spray Place 2 sprays  into both nostrils daily. 12/14/18   Elby Beck, FNP  lisinopril-hydrochlorothiazide (ZESTORETIC) 20-25 MG tablet Take 1 tablet by mouth daily. 11/01/19   Elby Beck, FNP  montelukast (SINGULAIR) 10 MG tablet Take 1 tablet (10 mg total) by mouth at bedtime. 11/01/19   Elby Beck, FNP  UNABLE TO FIND Med Name: CPAP (pressure 11)    [provider]    Physical Exam: Vitals:   05/15/20 1500 05/15/20 1530 05/15/20 1600 05/15/20 1708  BP: 129/62 (!) 146/75 (!) 144/67 (!) 154/82  Pulse: 62 67 (!) 58 (!) 52  Resp: 15 11 11 17   Temp:    97.9 F (36.6 C)  TempSrc:    Oral  SpO2: 97% 100% 96% 100%  Weight:      Height:       General: Not in acute distress HEENT:       Eyes: PERRL, EOMI, no scleral icterus.       ENT: No discharge from the ears and nose, no pharynx injection, no tonsillar enlargement.        Neck: No JVD, no bruit, no mass felt. Heme: No neck lymph node enlargement. Cardiac: S1/S2, RRR, No murmurs, No gallops or rubs. Respiratory:  No rales, wheezing, rhonchi or rubs. GI: Soft, nondistended, nontender, no rebound pain, no organomegaly, BS present. GU: No hematuria Ext: No pitting leg edema bilaterally. 2+DP/PT pulse bilaterally. Musculoskeletal: No joint deformities, No joint redness or warmth, no limitation of ROM in spin. Skin: No rashes.  Neuro: Alert, oriented X3, cranial nerves II-XII grossly intact, moves all extremities normally. Muscle strength 5/5 in all extremities, sensation to light touch intact. Brachial reflex 2+ bilaterally. Psych: Patient is not psychotic, no suicidal or hemocidal ideation.  Labs on Admission: I have personally reviewed following labs and imaging studies  CBC: Recent Labs  Lab 05/14/20 1622  WBC 9.8  HGB 15.0  HCT 44.4  MCV 92.5  PLT 161   Basic Metabolic Panel: Recent Labs  Lab 05/14/20 1622  NA 140  K 3.6  CL 102  CO2 28  GLUCOSE 82  BUN 10  CREATININE 0.64  CALCIUM 9.3   GFR: Estimated  Creatinine Clearance: 60.1 mL/min (by C-G formula based on SCr of 0.64 mg/dL). Liver Function Tests: No results for input(s): AST, ALT, ALKPHOS, BILITOT, PROT, ALBUMIN in the last 168 hours. No results for input(s): LIPASE, AMYLASE in the last 168 hours. No results for input(s): AMMONIA in the last 168 hours. Coagulation Profile: No results for input(s): INR, PROTIME in the last 168 hours. Cardiac Enzymes: No results for input(s): CKTOTAL, CKMB, CKMBINDEX, TROPONINI in the last 168 hours. BNP (last 3 results) No results for input(s): PROBNP in the last 8760 hours. HbA1C: No results for input(s): HGBA1C in the last 72 hours. CBG: No results for input(s): GLUCAP in the last 168 hours. Lipid Profile: No results for input(s): CHOL, HDL, LDLCALC, TRIG, CHOLHDL, LDLDIRECT in the last 72 hours. Thyroid Function Tests: No results for  input(s): TSH, T4TOTAL, FREET4, T3FREE, THYROIDAB in the last 72 hours. Anemia Panel: No results for input(s): VITAMINB12, FOLATE, FERRITIN, TIBC, IRON, RETICCTPCT in the last 72 hours. Urine analysis:    Component Value Date/Time   BILIRUBINUR neg 05/06/2020 1553   PROTEINUR Negative 05/06/2020 1553   UROBILINOGEN 0.2 05/06/2020 1553   NITRITE neg 05/06/2020 1553   LEUKOCYTESUR Negative 05/06/2020 1553   Sepsis Labs: @LABRCNTIP (procalcitonin:4,lacticidven:4) ) Recent Results (from the past 240 hour(s))  SARS Coronavirus 2 by RT PCR (hospital order, performed in [facility] hospital lab) Nasopharyngeal Nasopharyngeal Swab     Status: None   Collection Time: 05/15/20 11:16 AM   Specimen: Nasopharyngeal Swab  Result Value Ref Range Status   SARS Coronavirus 2 NEGATIVE NEGATIVE Final    Comment: (NOTE) SARS-CoV-2 target nucleic acids are NOT DETECTED.  The SARS-CoV-2 RNA is generally detectable in upper and lower respiratory specimens during the acute phase of infection. The lowest concentration of SARS-CoV-2 viral copies this assay can detect is  250 copies / mL. A negative result does not preclude SARS-CoV-2 infection and should not be used as the sole basis for treatment or other patient management decisions.  A negative result may occur with improper specimen collection / handling, submission of specimen other than nasopharyngeal swab, presence of viral mutation(s) within the areas targeted by this assay, and inadequate number of viral copies (<250 copies / mL). A negative result must be combined with clinical observations, patient history, and epidemiological information.  Fact Sheet for Patients:   StrictlyIdeas.no  Fact Sheet for Healthcare Providers: BankingDealers.co.za  This test is not yet approved or  cleared by the [place] FDA and has been authorized for detection and/or diagnosis of SARS-CoV-2 by FDA under an Emergency Use Authorization (EUA).  This EUA will remain in effect (meaning this test can be used) for the duration of the COVID-19 declaration under Section 564(b)(1) of the Act, 21 U.S.C. section 360bbb-3(b)(1), unless the authorization is terminated or revoked sooner.  Performed at [facility], [address] [place], [place] [address]      Radiological Exams on Admission: DG Chest 2 View  Result Date: 05/14/2020 CLINICAL DATA:  TIA symptoms.  Slurred speech. EXAM: CHEST - 2 VIEW COMPARISON:  Radiograph 05/06/2020 FINDINGS: The cardiomediastinal contours are normal. Atherosclerosis of the thoracic aorta. Pulmonary vasculature is normal. No consolidation, pleural effusion, or pneumothorax. No acute osseous abnormalities are seen. IMPRESSION: No acute chest findings. Electronically Signed   By: Keith Rake M.D.   On: 05/14/2020 17:26   CT Head Wo Contrast  Result Date: 05/14/2020 CLINICAL DATA:  Mental status change EXAM: CT HEAD WITHOUT CONTRAST TECHNIQUE: Contiguous axial images were obtained from the base of the skull through the  vertex without intravenous contrast. COMPARISON:  None. FINDINGS: Brain: No acute territorial infarction, hemorrhage or intracranial mass. Ventricles are nonenlarged. Chronic lacunar infarct or dilated perivascular space in the left basal ganglia. Vascular: No hyperdense vessels.  Carotid vascular calcification Skull: Normal. Negative for fracture or focal lesion. Sinuses/Orbits: No acute finding. Other: None IMPRESSION: No CT evidence for acute intracranial abnormality. Electronically Signed   By: Donavan Foil M.D.   On: 05/14/2020 17:19     EKG: Independently reviewed.  Sinus rhythm, QTC 412, LAD, poor R wave progression  Assessment/Plan Principal Problem:   TIA (transient ischemic attack) Active Problems:   Essential hypertension   OSA on CPAP   Hyperlipidemia   TIA (transient ischemic attack): Patient has intermittent slurred speech, left arm  weakness, vision loss, headache, poor balance. CT-head negative.  It is concerning for TIA versus stroke. Will do TIA work up. Neurology, Dr. Cheral Marker is consulted.  -Placed on MedSurg bed for observation - will follow up Neurology's Recs.  - Obtain MRI/MRA  - Check carotid dopplers  - start ASA and lipitor - fasting lipid panel and HbA1c  - 2D transthoracic echocardiography  - swallowing screen. If fails, will get SLP - Check UDS  - PT/OT consult  Essential hypertension -Hydralazine as needed -Continue home Prinzide  OSA - on CPAP  Hyperlipidemia -Start Lipitor 40 mg daily     DVT ppx:  SQ Lovenox Code Status: Full code Family Communication: Yes, patient's daughter    at bed side Disposition Plan:  Anticipate discharge back to previous environment Consults called: Neurology, Dr. Cheral Marker Admission status: Med-surg bed for obs   Status is: Observation  The patient remains OBS appropriate and will d/c before 2 midnights.  Dispo: The patient is from: Home              Anticipated d/c is to: Home              Anticipated d/c  date is: 1 day              Patient currently is not medically stable to d/c.         Date of Service 05/15/2020    [place]   If 7PM-7AM, please contact night-coverage [email or web address] 05/15/2020, 6:43 PM

## 2020-05-16 ENCOUNTER — Observation Stay: Payer: Medicare Other

## 2020-05-16 DIAGNOSIS — G459 Transient cerebral ischemic attack, unspecified: Secondary | ICD-10-CM | POA: Diagnosis not present

## 2020-05-16 DIAGNOSIS — I6523 Occlusion and stenosis of bilateral carotid arteries: Secondary | ICD-10-CM | POA: Diagnosis not present

## 2020-05-16 DIAGNOSIS — G4733 Obstructive sleep apnea (adult) (pediatric): Secondary | ICD-10-CM | POA: Diagnosis not present

## 2020-05-16 DIAGNOSIS — I10 Essential (primary) hypertension: Secondary | ICD-10-CM | POA: Diagnosis not present

## 2020-05-16 DIAGNOSIS — Z9989 Dependence on other enabling machines and devices: Secondary | ICD-10-CM | POA: Diagnosis not present

## 2020-05-16 LAB — HEMOGLOBIN A1C
Hgb A1c MFr Bld: 5.4 % (ref 4.8–5.6)
Mean Plasma Glucose: 108.28 mg/dL

## 2020-05-16 LAB — LIPID PANEL
Cholesterol: 241 mg/dL — ABNORMAL HIGH (ref 0–200)
HDL: 70 mg/dL (ref >40)
LDL Cholesterol: 142 mg/dL — ABNORMAL HIGH (ref 0–99)
Total CHOL/HDL Ratio: 3.4 RATIO
Triglycerides: 144 mg/dL (ref <150)
VLDL: 29 mg/dL (ref 0–40)

## 2020-05-16 LAB — ECHOCARDIOGRAM COMPLETE
Area-P 1/2: 3.17 cm2
S' Lateral: 3.37 cm

## 2020-05-16 LAB — HIV ANTIBODY (ROUTINE TESTING W REFLEX): HIV Screen 4th Generation wRfx: NONREACTIVE

## 2020-05-16 MED ORDER — ASPIRIN 325 MG PO TABS
325.0000 mg | ORAL_TABLET | Freq: Every day | ORAL | 0 refills | Status: DC
Start: 2020-05-17 — End: 2020-09-03

## 2020-05-16 MED ORDER — ATORVASTATIN CALCIUM 40 MG PO TABS
40.0000 mg | ORAL_TABLET | Freq: Every day | ORAL | 0 refills | Status: DC
Start: 2020-05-17 — End: 2020-05-26

## 2020-05-16 NOTE — Plan of Care (Signed)

## 2020-05-16 NOTE — Progress Notes (Signed)
SLP Cancellation Note  Patient Details Name: VITTORIA NOREEN MRN: [id number] DOB: [birth year]   Cancelled treatment:       Reason Eval/Treat Not Completed: SLP screened, no needs identified, will sign off  Natalee Tomkiewicz B. Rutherford Nail M.S., CCC-SLP, [place] Office [phone]  Stormy Fabian 05/16/2020, 12:22 PM

## 2020-05-16 NOTE — Plan of Care (Signed)
  Problem: Activity: Goal: Risk for activity intolerance will decrease Outcome: Progressing   

## 2020-05-16 NOTE — Discharge Summary (Signed)
Physician Discharge Summary  Patient ID: Christina Meadows MRN: [id number] DOB/AGE: 02-25-1951 69 y.o.  Admit date: 05/15/2020 Discharge date: 05/16/2020  Admission Diagnoses:  Discharge Diagnoses:  Principal Problem:   TIA (transient ischemic attack) Active Problems:   Essential hypertension   OSA on CPAP   Hyperlipidemia   Discharged Condition: good  Hospital Course:  Christina Meadows is a 69 y.o. female with medical history significant of hypertension, hyperlipidemia, depression, anxiety, OSA on CPAP, arthritis, who presents with slurred speech, left arm weakness, vision loss, headache, poor balance. She is seen by neurology, started on aspirin and Lipitor.  Stroke work-up showed elevated LDL, no atrial fibrillation.  MRI brain did not show any acute stroke, carotid ultrasound did not show significant stenosis.  At this point, patient symptom has resolved, she is medically stable to be discharged.  She will be followed by neurology and primary care physician in the near future.  She will be continued treated with aspirin and Lipitor.  1.  TIA. Negative for stroke.  Continue aspirin and Plavix.  2.  Essential hypertension. Resume home medicines.  3.  Obstructive sleep apnea.  Continue CPAP.  4. Dyslipidemia. LDL 142, Lipitor 40 mg daily started.   Consults: neurology  Significant Diagnostic Studies:   BILATERAL CAROTID DUPLEX ULTRASOUND  TECHNIQUE: Pearline Cables scale imaging, color Doppler and duplex ultrasound were performed of bilateral carotid and vertebral arteries in the neck.  COMPARISON:  None.  FINDINGS: Criteria: Quantification of carotid stenosis is based on velocity parameters that correlate the residual internal carotid diameter with NASCET-based stenosis levels, using the diameter of the distal internal carotid lumen as the denominator for stenosis measurement.  The following velocity measurements were obtained:  RIGHT  ICA:  Systolic 64 cm/sec,  Diastolic 19 cm/sec  CCA:  57 cm/sec  SYSTOLIC ICA/CCA RATIO:  1.1  ECA:  83 cm/sec  LEFT  ICA:  Systolic 80 cm/sec, Diastolic 29 cm/sec  CCA:  73 cm/sec  SYSTOLIC ICA/CCA RATIO:  1.1  ECA:  98 cm/sec  Right Brachial SBP: Not acquired  Left Brachial SBP: Not acquired  RIGHT CAROTID ARTERY: No significant calcified disease of the right common carotid artery. Intermediate waveform maintained. Heterogeneous plaque without significant calcifications at the right carotid bifurcation. Low resistance waveform of the right ICA. No significant tortuosity.  RIGHT VERTEBRAL ARTERY: Antegrade flow with low resistance waveform.  LEFT CAROTID ARTERY: No significant calcified disease of the left common carotid artery. Intermediate waveform maintained. Heterogeneous plaque at the left carotid bifurcation without significant calcifications. Low resistance waveform of the left ICA.  LEFT VERTEBRAL ARTERY:  Antegrade flow with low resistance waveform.  IMPRESSION: Color duplex indicates minimal heterogeneous plaque, with no hemodynamically significant stenosis by duplex criteria in the extracranial cerebrovascular circulation.  Signed,  Dulcy Fanny. Dellia Nims, RPVI  [facility]  [facility]   Electronically Signed   By: Corrie Mckusick D.O.   On: 05/16/2020 13:33  MRI HEAD WITHOUT AND WITH CONTRAST  MRA HEAD WITHOUT CONTRAST  TECHNIQUE: Multiplanar, multiecho pulse sequences of the brain and surrounding structures were obtained without and with intravenous contrast. Angiographic images of the head were obtained using MRA technique without contrast.  CONTRAST:  85mL GADAVIST GADOBUTROL 1 MMOL/ML IV SOLN  COMPARISON:  None.  FINDINGS: MRI HEAD  Brain: There is no acute infarction or intracranial hemorrhage. There is no intracranial mass, mass effect, or edema. There is no hydrocephalus or  extra-axial fluid collection. Ventricles and sulci are normal in  size and configuration. Few scattered foci of T2 hyperintensity in the supratentorial white matter are nonspecific but may reflect mild chronic microvascular ischemic changes. No abnormal enhancement.  Vascular: Major vessel flow voids at the skull base are preserved.  Skull and upper cervical spine: Normal marrow signal is preserved.  Sinuses/Orbits: Minor mucosal thickening.  Orbits are unremarkable.  Other: Sella is unremarkable.  Mastoid air cells are clear.  MRA HEAD  Intracranial internal carotid arteries are patent. Middle and anterior cerebral arteries are patent. Intracranial vertebral arteries, basilar artery, posterior cerebral arteries are patent. There is no significant stenosis or aneurysm.  IMPRESSION: No evidence of recent infarction, hemorrhage, or mass. Mild chronic microvascular ischemic changes.  No proximal intracranial vessel occlusion or significant stenosis.   Electronically Signed   By: Macy Mis M.D.   On: 05/15/2020 21:47  Echo: Left ventricular ejection fraction, by estimation, is 55 to 60%. The left ventricle has normal function. The left ventricle has no regional wall motion abnormalities. Left ventricular diastolic parameters are consistent with Grade I diastolic dysfunction (impaired relaxation). 2. Right ventricular systolic function is normal. The right ventricular size is normal. There is normal pulmonary artery systolic pressure. The estimated right ventricular systolic pressure is 48.5 mmHg.  Treatments: Stroke work-up.  Discharge Exam: Blood pressure 125/65, pulse (!) 53, temperature 98 F (36.7 C), temperature source Oral, resp. rate 18, height 4\' 11"  (1.499 m), weight 76.7 kg, SpO2 99 %. General appearance: alert and cooperative Resp: clear to auscultation bilaterally Cardio: regular rate and rhythm, S1, S2 normal, no murmur, click, rub or gallop GI:  soft, non-tender; bowel sounds normal; no masses,  no organomegaly Extremities: extremities normal, atraumatic, no cyanosis or edema Neurologic: Alert and oriented X 3, normal strength and tone. Normal symmetric reflexes. Normal coordination and gait  Disposition: Discharge disposition: 01-Home or Self Care       Discharge Instructions    Diet - low sodium heart healthy   Complete by: As directed    Increase activity slowly   Complete by: As directed      Allergies as of 05/16/2020   No Known Allergies     Medication List    STOP taking these medications   acetaminophen 500 MG tablet Commonly known as: TYLENOL     TAKE these medications   aspirin 325 MG tablet Take 1 tablet (325 mg total) by mouth daily. Start taking on: May 17, 2020   atorvastatin 40 MG tablet Commonly known as: LIPITOR Take 1 tablet (40 mg total) by mouth daily. Start taking on: May 17, 2020   Calcium+D3 Gradual Release 600-40-500 MG-MG-UNIT Tb24 Generic drug: Calcium-Magnesium-Vitamin D Take by mouth.   cetirizine 10 MG tablet Commonly known as: ZYRTEC Take by mouth.   cholecalciferol 25 MCG (1000 UNIT) tablet Commonly known as: VITAMIN D3 Take 1,000 Units by mouth daily.   citalopram 40 MG tablet Commonly known as: CELEXA Take 1 tablet (40 mg total) by mouth daily.   fluticasone 50 MCG/ACT nasal spray Commonly known as: FLONASE Place 2 sprays into both nostrils daily.   lisinopril-hydrochlorothiazide 20-25 MG tablet Commonly known as: ZESTORETIC Take 1 tablet by mouth daily.   montelukast 10 MG tablet Commonly known as: SINGULAIR Take 1 tablet (10 mg total) by mouth at bedtime.       Follow-up Information    Elby Beck, FNP Follow up in 1 week(s).   Specialties: Nurse Practitioner, Family Medicine Contact information: [address] [place] [address] [phone]  [facility] Follow up in 2 week(s).    Contact information: [place] [phone]              Signed: Sharen Hones 05/16/2020, 4:09 PM

## 2020-05-16 NOTE — Evaluation (Signed)
Physical Therapy Evaluation Patient Details Name: Christina Meadows MRN: [id number] DOB: 11/13/1950 Today's Date: 05/16/2020   History of Present Illness  Christina Meadows is a 69yoF who comes to [facility] on 05/14/20 c slurred speech, LUE weakness, vision loss, HA, dysequilibrium. PMH: HTN, HLD, depression, GAD, OSA on CPAP.  Clinical Impression  Pt admitted with above diagnosis. Pt currently with functional limitations due to the deficits listed below (see "PT Problem List"). Upon entry, pt in bed, awake and agreeable to participate. Pt performs all basic mobility at baseline level of function, has increased HA, but this does not affect performance. No gross asymetry noted. No LOB or unsteadiness. Patient is at functional baseline, all education completed, and time is given to address all questions/concerns. No additional skilled PT services needed at this time, PT signing off. PT recommends daily ambulation ad lib or with nursing staff as needed to prevent deconditioning.      Follow Up Recommendations No PT follow up    Equipment Recommendations  None recommended by PT    Recommendations for Other Services       Precautions / Restrictions Precautions Precautions: Fall Restrictions Weight Bearing Restrictions: No      Mobility  Bed Mobility Overal bed mobility: Independent             General bed mobility comments: up to chair upon arrival  Transfers Overall transfer level: Independent Equipment used: None             General transfer comment: no LOB and demonstrated good safety awareness  Ambulation/Gait   Gait Distance (Feet): 420 Feet Assistive device: None Gait Pattern/deviations: WFL(Within Functional Limits) Gait velocity: 0.73m/s      Stairs            Wheelchair Mobility    Modified Rankin (Stroke Patients Only)       Balance Overall balance assessment: No apparent balance deficits (not formally assessed);Independent                                            Pertinent Vitals/Pain Pain Assessment: No/denies pain Pain Score: 3  Pain Location: headache Pain Descriptors / Indicators: Aching Pain Intervention(s): Monitored during session;Repositioned;Premedicated before session    Home Living Family/patient expects to be discharged to:: Private residence Living Arrangements: Alone (Lives in a camper at [facility] house) Available Help at Discharge: Family Type of Home:  (camper) Home Access: Stairs to enter   Entrance Stairs-Number of Steps: 3 STE Home Layout: One level Home Equipment: Cane - single point;Walker - 2 wheels;Bedside commode      Prior Function Level of Independence: Independent               Hand Dominance   Dominant Hand: Right    Extremity/Trunk Assessment   Upper Extremity Assessment Upper Extremity Assessment: Overall WFL for tasks assessed    Lower Extremity Assessment Lower Extremity Assessment: Overall WFL for tasks assessed    Cervical / Trunk Assessment Cervical / Trunk Assessment: Normal  Communication   Communication: No difficulties  Cognition Arousal/Alertness: Awake/alert Behavior During Therapy: WFL for tasks assessed/performed Overall Cognitive Status: Within Functional Limits for tasks assessed                                          General Comments      Exercises     Assessment/Plan    PT Assessment All further PT needs can be met in the next venue of care;Patent does not need any further PT services  PT Problem List Decreased activity tolerance;Decreased mobility       PT Treatment Interventions DME instruction;Gait training;Stair training;Functional mobility training    PT Goals (Current goals can be found in the Care Plan section)  Acute Rehab PT Goals Patient Stated Goal: to return home PT Goal Formulation: All assessment and education complete, DC therapy    Frequency     Barriers to discharge        Co-evaluation                AM-PAC PT "6 Clicks" Mobility  Outcome Measure Help needed turning from your back to your side while in a flat bed without using bedrails?: None Help needed moving from lying on your back to sitting on the side of a flat bed without using bedrails?: None Help needed moving to and from a bed to a chair (including a wheelchair)?: None Help needed standing up from a chair using your arms (e.g., wheelchair or bedside chair)?: None Help needed to walk in hospital room?: None Help needed climbing 3-5 steps with a railing? : None 6 Click Score: 24    End of Session Equipment Utilized During Treatment: Gait belt Activity Tolerance: Patient tolerated treatment well;No increased pain Patient left: in chair;with family/visitor present   PT Visit Diagnosis: Other symptoms and signs involving the nervous system (R29.898)    Time: 1116-1131 PT Time Calculation (min) (ACUTE ONLY): 15 min   Charges:   PT Evaluation $PT Eval Low Complexity: 1 Low         1:26 PM, 05/16/20 Allan C Buccola, PT, DPT Physical Therapist - [facility]  [phone] (ASCOM)    Buccola,Allan C 05/16/2020, 1:24 PM   

## 2020-05-16 NOTE — Evaluation (Signed)
Occupational Therapy Evaluation Patient Details Name: Christina Meadows MRN: [id number] DOB: [birth year] Today's Date: 05/16/2020    History of Present Illness 69 y.o. female with a PMHx of arthritis, HLD, HTN and OSA (on CPAP) who presented to the ED in a wheelchair yesterday with slurred speech, mild confusion, vertigo, headache and vision changes and LUE weakness. Per patient the left arm weakness started 3 weeks ago. On arrival to the ED, she was alert and oriented without noticeable dysarthria.   Clinical Impression   Pt is functionally at baseline during evaluation. She is able to ambulate in room, engage in functional transfers, and self care tasks at independent level without increased time or use of AD. Pt verbalized only concern at this time being her vision on L eye. She lives in [place] behind her daughter's home and she goes over there to shower. She interacts with family throughout the day and reports having assistance at home if she needs it. Pt needs no further OT intervention at this time with no follow up recommended. Pt is agreeable. OT will SIGN OFF. Thank you for referral.     Follow Up Recommendations  No OT follow up    Equipment Recommendations  None recommended by OT    Recommendations for Other Services Other (comment) (none at this time)     Precautions / Restrictions Precautions Precautions: Fall Restrictions Weight Bearing Restrictions: No      Mobility Bed Mobility Overal bed mobility: Independent      General bed mobility comments: from flat bed without use of bedrails  Transfers Overall transfer level: Independent Equipment used: None      General transfer comment: no LOB and demonstrated good safety awareness    Balance Overall balance assessment: No apparent balance deficits (not formally assessed)        ADL either performed or assessed with clinical judgement   ADL Overall ADL's : At baseline;Independent          Vision Baseline  Vision/History: Wears glasses Wears Glasses: At all times Patient Visual Report: Other (comment) (Pt reports eye pain and "tunnel vision")              Pertinent Vitals/Pain Pain Assessment: 0-10 Pain Score: 3  Pain Location: headache Pain Descriptors / Indicators: Aching Pain Intervention(s): Monitored during session;Repositioned;Premedicated before session     Hand Dominance Right   Extremity/Trunk Assessment Upper Extremity Assessment Upper Extremity Assessment: Overall WFL for tasks assessed   Lower Extremity Assessment Lower Extremity Assessment: Overall WFL for tasks assessed   Cervical / Trunk Assessment Cervical / Trunk Assessment: Normal   Communication Communication Communication: No difficulties   Cognition Arousal/Alertness: Awake/alert Behavior During Therapy: WFL for tasks assessed/performed Overall Cognitive Status: Within Functional Limits for tasks assessed                   Home Living Family/patient expects to be discharged to:: Private residence Living Arrangements: Children;Other (Comment) (teenage grandson's) Available Help at Discharge: Available 24 hours/day;Family Type of Home: Other(Comment) (Pt lives in camper behind her daughter's home) Home Access: Stairs to enter Technical brewer of Steps: 3 STE   Home Layout: One level     Bathroom Shower/Tub: Tub/shower unit;Other (comment) (uses shower at daughter's home)     Bathroom Accessibility: Yes How Accessible: Accessible via walker Home Equipment: None          Prior Functioning/Environment Level of Independence: Independent  OT Goals(Current goals can be found in the care plan section) Acute Rehab OT Goals Patient Stated Goal: to return home OT Goal Formulation: With patient Time For Goal Achievement: 05/30/20 Potential to Achieve Goals: Fair  OT Frequency:     Barriers to D/C: Other (comment)  none known at this time           AM-PAC OT "6 Clicks" Daily Activity     Outcome Measure Help from another person eating meals?: None Help from another person taking care of personal grooming?: None Help from another person toileting, which includes using toliet, bedpan, or urinal?: None Help from another person bathing (including washing, rinsing, drying)?: None Help from another person to put on and taking off regular upper body clothing?: None Help from another person to put on and taking off regular lower body clothing?: None 6 Click Score: 24   End of Session Nurse Communication: Mobility status;Patient requests pain meds  Activity Tolerance: Patient tolerated treatment well Patient left: in chair;with call bell/phone within reach;with chair alarm set                   Time: [phone] OT Time Calculation (min): 23 min Charges:  OT General Charges $OT Visit: 1 Visit OT Evaluation $OT Eval Low Complexity: 1 Low OT Treatments $Self Care/Home Management : 8-22 mins Darleen Crocker, MS, OTR/L , CBIS ascom [phone]  05/16/20, 12:22 PM

## 2020-05-16 NOTE — Progress Notes (Signed)
Patient is being discharged home this afternoon. Daughter will pick up patient. DC & Rx instructions given and patient acknowledged understanding. All questions answered. IV removed and patient belongings packed.

## 2020-05-21 ENCOUNTER — Other Ambulatory Visit: Payer: Self-pay

## 2020-05-21 ENCOUNTER — Ambulatory Visit
Admission: RE | Admit: 2020-05-21 | Discharge: 2020-05-21 | Disposition: A | Discharge status: Home / Self Care | Payer: Medicare Other | Source: Ambulatory Visit | Attending: Family Medicine | Admitting: Family Medicine

## 2020-05-21 DIAGNOSIS — R634 Abnormal weight loss: Secondary | ICD-10-CM | POA: Diagnosis not present

## 2020-05-21 DIAGNOSIS — K8020 Calculus of gallbladder without cholecystitis without obstruction: Secondary | ICD-10-CM | POA: Diagnosis not present

## 2020-05-21 DIAGNOSIS — R1030 Lower abdominal pain, unspecified: Secondary | ICD-10-CM | POA: Diagnosis not present

## 2020-05-21 DIAGNOSIS — R102 Pelvic and perineal pain: Secondary | ICD-10-CM | POA: Diagnosis not present

## 2020-05-21 MED ORDER — IOHEXOL 300 MG/ML  SOLN
100.0000 mL | Freq: Once | INTRAMUSCULAR | Status: AC | PRN
  Administered 2020-05-21: 100 mL via INTRAVENOUS

## 2020-05-24 ENCOUNTER — Encounter: Payer: Self-pay | Admitting: Family Medicine

## 2020-05-24 ENCOUNTER — Ambulatory Visit (INDEPENDENT_AMBULATORY_CARE_PROVIDER_SITE_OTHER): Payer: Medicare Other | Admitting: Family Medicine

## 2020-05-24 ENCOUNTER — Other Ambulatory Visit: Payer: Self-pay

## 2020-05-24 VITALS — BP 110/74 | HR 68 | Temp 98.2°F | Ht 59.0 in | Wt 172.0 lb

## 2020-05-24 DIAGNOSIS — E785 Hyperlipidemia, unspecified: Secondary | ICD-10-CM

## 2020-05-24 DIAGNOSIS — Z23 Encounter for immunization: Secondary | ICD-10-CM | POA: Diagnosis not present

## 2020-05-24 DIAGNOSIS — G459 Transient cerebral ischemic attack, unspecified: Secondary | ICD-10-CM | POA: Diagnosis not present

## 2020-05-24 DIAGNOSIS — K8020 Calculus of gallbladder without cholecystitis without obstruction: Secondary | ICD-10-CM | POA: Diagnosis not present

## 2020-05-24 DIAGNOSIS — I639 Cerebral infarction, unspecified: Secondary | ICD-10-CM | POA: Diagnosis not present

## 2020-05-24 NOTE — Patient Instructions (Addendum)
Ask your daughter about antihistamine eye drops  [facility]- for shingles vaccine and Tdap   Cholelithiasis  Cholelithiasis is also called "gallstones." It is a kind of gallbladder disease. The gallbladder is an organ that stores a liquid (bile) that helps you digest fat. Gallstones may not cause symptoms (may be silent gallstones) until they cause a blockage, and then they can cause pain (gallbladder attack). Follow these instructions at home:  Take over-the-counter and prescription medicines only as told by your doctor.  Stay at a healthy weight.  Eat healthy foods. This includes: ? Eating fewer fatty foods, like fried foods. ? Eating fewer refined carbs (refined carbohydrates). Refined carbs are breads and grains that are highly processed, like white bread and white rice. Instead, choose whole grains like whole-wheat bread and brown rice. ? Eating more fiber. Almonds, fresh fruit, and beans are healthy sources of fiber.  Keep all follow-up visits as told by your doctor. This is important. Contact a doctor if:  You have sudden pain in the upper right side of your belly (abdomen). Pain might spread to your right shoulder or your chest. This may be a sign of a gallbladder attack.  You feel sick to your stomach (are nauseous).  You throw up (vomit).  You have been diagnosed with gallstones that have no symptoms and you get: ? Belly pain. ? Discomfort, burning, or fullness in the upper part of your belly (indigestion). Get help right away if:  You have sudden pain in the upper right side of your belly, and it lasts for more than 2 hours.  You have belly pain that lasts for more than 5 hours.  You have a fever or chills.  You keep feeling sick to your stomach or you keep throwing up.  Your skin or the whites of your eyes turn yellow (jaundice).  You have dark-colored pee (urine).  You have light-colored poop (stool). Summary  Cholelithiasis is also called  "gallstones."  The gallbladder is an organ that stores a liquid (bile) that helps you digest fat.  Silent gallstones are gallstones that do not cause symptoms.  A gallbladder attack may cause sudden pain in the upper right side of your belly. Pain might spread to your right shoulder or your chest. If this happens, contact your doctor.  If you have sudden pain in the upper right side of your belly that lasts for more than 2 hours, get help right away. This information is not intended to replace advice given to you by your health care provider. Make sure you discuss any questions you have with your health care provider. Document Revised: 08/06/2017 Document Reviewed: 05/10/2016 Elsevier Patient Education  2020 [employer].

## 2020-05-24 NOTE — Progress Notes (Signed)
Subjective:    Patient ID: Christina Meadows, female    DOB: 1951/02/16, 68 y.o.   MRN: [id number]  HPI Chief Complaint  Patient presents with  . Follow-up    hosp f/u TIA  . Headache   This is a 69 yo female who presents today for hospital follow up. She was admitted 9/8-9/05-2020 for TIA. She presented with slurred speech, left arm weakness, vision loss (was seen at [facility] and sent to ER), headache and poor balance. She was started on lipitor and ASA 325. No afib, negative MRI, negative carotid US.  Since she got home, has continued to have fatigue, headache improved. Some grit feeling in eye, burning. Notices more at night.  Has appointment with neurologist 06/04/20 Melrose Nakayama).   Cholelithiasis- had CT abdomen earlier this week for epigastric pain, right scapular pain, worse after eating.   Review of Systems Per HPI    Objective:   Physical Exam Vitals reviewed.  Constitutional:      General: She is not in acute distress.    Appearance: She is well-developed. She is obese. She is not ill-appearing, toxic-appearing or diaphoretic.  HENT:     Head: Normocephalic and atraumatic.     Right Ear: Tympanic membrane, ear canal and external ear normal.     Left Ear: Ear canal and external ear normal.     Ears:     Comments: Left TM dull.     Nose: Nose normal.     Mouth/Throat:     Mouth: Mucous membranes are moist.     Pharynx: Oropharynx is clear.  Eyes:     Extraocular Movements: Extraocular movements intact.     Conjunctiva/sclera: Conjunctivae normal.     Pupils: Pupils are equal, round, and reactive to light.  Cardiovascular:     Rate and Rhythm: Normal rate and regular rhythm.     Heart sounds: Normal heart sounds.  Pulmonary:     Effort: Pulmonary effort is normal.     Breath sounds: Normal breath sounds.  Musculoskeletal:     Cervical back: Normal range of motion and neck supple.     Right lower leg: No edema.     Left lower leg: No edema.  Skin:     General: Skin is warm and dry.  Neurological:     Mental Status: She is alert and oriented to person, place, and time.     Comments: Normal gait. Fluid speech. UE/LE strength 5/5.           BP 110/74   Pulse 68   Temp 98.2 F (36.8 C) (Temporal)   Ht 4\' 11"  (1.499 m)   Wt 172 lb (78 kg)   SpO2 96%   BMI 34.74 kg/m  Wt Readings from Last 3 Encounters:  05/24/20 172 lb (78 kg)  05/14/20 169 lb (76.7 kg)  05/06/20 174 lb 8 oz (79.2 kg)    Assessment & Plan:  1. Calculus of gallbladder without cholecystitis without obstruction - Provided written and verbal information regarding diagnosis and treatment. - encouraged her to avoid fatty, greasy foods - Ambulatory referral to General Surgery  2. TIA (transient ischemic attack) - follow up as scheduled with neurology - discussed importance of weight loss, blood pressure and lipid control  3. Hyperlipidemia, unspecified hyperlipidemia type - reordered atorvastatin  4. Need for influenza vaccination - Flu Vaccine QUAD High Dose(Fluad)  - follow up in 3 months  This visit occurred during the SARS-CoV-2 public health emergency.  Safety protocols were in place, including screening questions prior to the visit, additional usage of staff PPE, and extensive cleaning of exam room while observing appropriate contact time as indicated for disinfecting solutions.    Clarene Reamer, FNP-BC  [facility]  05/24/2020 2:45 PM

## 2020-05-26 ENCOUNTER — Encounter: Payer: Self-pay | Admitting: Family Medicine

## 2020-05-26 MED ORDER — ATORVASTATIN CALCIUM 40 MG PO TABS: 40.0000 mg | ORAL_TABLET | Freq: Every day | ORAL | 3 refills | Status: AC

## 2020-06-04 DIAGNOSIS — Z8669 Personal history of other diseases of the nervous system and sense organs: Secondary | ICD-10-CM | POA: Diagnosis not present

## 2020-06-04 DIAGNOSIS — R270 Ataxia, unspecified: Secondary | ICD-10-CM | POA: Insufficient documentation

## 2020-06-04 DIAGNOSIS — G4733 Obstructive sleep apnea (adult) (pediatric): Secondary | ICD-10-CM | POA: Diagnosis not present

## 2020-06-04 DIAGNOSIS — R531 Weakness: Secondary | ICD-10-CM | POA: Diagnosis not present

## 2020-06-04 DIAGNOSIS — R4781 Slurred speech: Secondary | ICD-10-CM | POA: Insufficient documentation

## 2020-06-13 DIAGNOSIS — K8020 Calculus of gallbladder without cholecystitis without obstruction: Secondary | ICD-10-CM | POA: Diagnosis not present

## 2020-06-25 ENCOUNTER — Telehealth: Payer: Self-pay

## 2020-06-25 NOTE — Telephone Encounter (Signed)
Pt left v/m requesting cb to see if cholesterol med had been refilled. I spoke with pt and advised on 05/26/20 atorvastatin 40 mg # 90 x 3 was sent electronically to [address]. Pt will ck with pharmacy about requesting refill of med. Nothing further needed at this time.

## 2020-07-25 ENCOUNTER — Other Ambulatory Visit: Payer: Self-pay

## 2020-07-25 MED ORDER — DICLOFENAC SODIUM 75 MG PO TBEC
75.0000 mg | DELAYED_RELEASE_TABLET | Freq: Two times a day (BID) | ORAL | 0 refills | Status: DC | PRN
Start: 2020-07-25 — End: 2020-11-25

## 2020-07-25 NOTE — Telephone Encounter (Signed)
Pt has been taking tylenol for joint pain and since weather is cooler pt said the tylenol is not helping the joint pain and pt is requesting refill on diclofenac that was given to pt on 11/01/19. Diclofenac was on hx med list. Pt request refill if possible before Glenda Chroman FNP return next wk will send note to Dr Einar Pheasant.[facility] [address]

## 2020-08-02 DIAGNOSIS — Z23 Encounter for immunization: Secondary | ICD-10-CM | POA: Diagnosis not present

## 2020-09-03 ENCOUNTER — Ambulatory Visit (INDEPENDENT_AMBULATORY_CARE_PROVIDER_SITE_OTHER): Payer: Medicare Other | Admitting: Podiatry

## 2020-09-03 ENCOUNTER — Other Ambulatory Visit: Payer: Self-pay

## 2020-09-03 ENCOUNTER — Encounter: Payer: Self-pay | Admitting: Podiatry

## 2020-09-03 DIAGNOSIS — M19071 Primary osteoarthritis, right ankle and foot: Secondary | ICD-10-CM | POA: Diagnosis not present

## 2020-09-03 DIAGNOSIS — M79671 Pain in right foot: Secondary | ICD-10-CM

## 2020-09-03 NOTE — Progress Notes (Signed)
Subjective:  Patient ID: Christina Meadows, female    DOB: 1951/06/22,  MRN: [id number]  Chief Complaint  Patient presents with  . Arthritis    Patient comes in today requesting an injection in right hallux again.  She stated "it flared up about a month ago and its a lot of pressure under my nail and it throbs at night"    69 y.o. female presents with the above complaint.  Patient presents with painful right first metatarsophalangeal joint.  Patient states the injection did not only last 2 weeks.  She would like to do another injection.  She denies any other acute complaints..  Review of Systems: Negative except as noted in the HPI. Denies N/V/F/Ch.  Past Medical History:  Diagnosis Date  . Arthritis   . Hyperlipidemia   . Hypertension   . OSA on CPAP 2019    Current Outpatient Medications:  .  atorvastatin (LIPITOR) 40 MG tablet, Take 1 tablet (40 mg total) by mouth daily., Disp: 90 tablet, Rfl: 3 .  CALCIUM+D3 GRADUAL RELEASE 600-40-500 MG-MG-UNIT TB24, Take by mouth., Disp: , Rfl:  .  cetirizine (ZYRTEC) 10 MG tablet, Take by mouth., Disp: , Rfl:  .  cholecalciferol (VITAMIN D3) 25 MCG (1000 UNIT) tablet, Take 1,000 Units by mouth daily., Disp: , Rfl:  .  citalopram (CELEXA) 40 MG tablet, Take 1 tablet (40 mg total) by mouth daily., Disp: 90 tablet, Rfl: 3 .  diclofenac (VOLTAREN) 75 MG EC tablet, Take 1 tablet (75 mg total) by mouth 2 (two) times daily as needed. TAKE 1 TABLET BY MOUTH TWICE DAILY (IF USING THIS MEDICATION DO NOT USE CELEBREX OR OTHER ANTI INFLAMMATORY), Disp: 60 tablet, Rfl: 0 .  fluticasone (FLONASE) 50 MCG/ACT nasal spray, Place 2 sprays into both nostrils daily., Disp: 16 g, Rfl: 5 .  lisinopril-hydrochlorothiazide (ZESTORETIC) 20-25 MG tablet, Take 1 tablet by mouth daily., Disp: 90 tablet, Rfl: 3 .  montelukast (SINGULAIR) 10 MG tablet, Take 1 tablet (10 mg total) by mouth at bedtime., Disp: 90 tablet, Rfl: 3  Social History   Tobacco Use  Smoking Status  Former Smoker  . Types: Cigarettes  . Quit date: 09/07/1978  . Years since quitting: 42.0  Smokeless Tobacco Never Used    No Known Allergies Objective:   There were no vitals filed for this visit. There is no height or weight on file to calculate BMI. Constitutional Well developed. Well nourished.  Vascular Dorsalis pedis pulses palpable bilaterally. Posterior tibial pulses palpable bilaterally. Capillary refill normal to all digits.  No cyanosis or clubbing noted. Pedal hair growth normal.  Neurologic Normal speech. Oriented to person, place, and time. Epicritic sensation to light touch grossly present bilaterally.  Dermatologic Nails well groomed and normal in appearance. No open wounds. No skin lesions.  Orthopedic:  No pain on palpation to the right metatarsophalangeal joint.  Limited range of motion associated with hallux rigidus findings.  No intra-articular pain noted.  No pain with range of motion of the first metatarsophalangeal joint active and passive.  Mild pain at the sesamoid complex.  No pain at the second metatarsophalangeal joint or any other respective metatarsophalangeal joint.  Dorsal prominence noted   Radiographs: 2 views of skeletally mature adult foot: Plantar heel spurring noted.  Mild elevation of the first metatarsal noted.  Severe arthritic changes noted of the met metatarsophalangeal joint first on the right.  Dorsal flag sign noted.  Mild arthritic changes noted noted at the midfoot.  Sesamoid position is  in within normal limits.  No other bony abnormalities identified.  Assessment:   1. Arthritis of first metatarsophalangeal (MTP) joint of right foot   2. Pain in right foot    Plan:  Patient was evaluated and treated and all questions answered.  Right first metatarsophalangeal joint arthritis -Given that patient has recurrence of the pain.  And given that this is lasting for about 6 months time.  I believe patient will benefit from continued steroid  injection as long as the increment/duration of the steroid injection is not becoming too frequent.  Patient states understanding like to proceed with a steroid injection. -A steroid injection was performed at right first MPJ using 1% plain Lidocaine and 10 mg of Kenalog. This was well tolerated.     No follow-ups on file.

## 2020-10-18 DIAGNOSIS — J0100 Acute maxillary sinusitis, unspecified: Secondary | ICD-10-CM | POA: Diagnosis not present

## 2020-10-18 DIAGNOSIS — R059 Cough, unspecified: Secondary | ICD-10-CM | POA: Diagnosis not present

## 2020-11-20 ENCOUNTER — Ambulatory Visit: Payer: Medicare Other | Admitting: Family Medicine

## 2020-11-20 ENCOUNTER — Other Ambulatory Visit: Payer: Self-pay

## 2020-11-22 ENCOUNTER — Other Ambulatory Visit: Payer: Self-pay

## 2020-11-25 ENCOUNTER — Encounter: Payer: Self-pay | Admitting: Family Medicine

## 2020-11-25 ENCOUNTER — Other Ambulatory Visit: Payer: Self-pay

## 2020-11-25 ENCOUNTER — Ambulatory Visit (INDEPENDENT_AMBULATORY_CARE_PROVIDER_SITE_OTHER): Payer: Medicare Other | Admitting: Family Medicine

## 2020-11-25 VITALS — BP 132/70 | HR 63 | Temp 97.7°F | Ht 59.0 in | Wt 176.0 lb

## 2020-11-25 DIAGNOSIS — M170 Bilateral primary osteoarthritis of knee: Secondary | ICD-10-CM

## 2020-11-25 DIAGNOSIS — I10 Essential (primary) hypertension: Secondary | ICD-10-CM | POA: Diagnosis not present

## 2020-11-25 DIAGNOSIS — J309 Allergic rhinitis, unspecified: Secondary | ICD-10-CM

## 2020-11-25 DIAGNOSIS — J302 Other seasonal allergic rhinitis: Secondary | ICD-10-CM

## 2020-11-25 DIAGNOSIS — E785 Hyperlipidemia, unspecified: Secondary | ICD-10-CM | POA: Diagnosis not present

## 2020-11-25 DIAGNOSIS — Z1159 Encounter for screening for other viral diseases: Secondary | ICD-10-CM | POA: Diagnosis not present

## 2020-11-25 DIAGNOSIS — F419 Anxiety disorder, unspecified: Secondary | ICD-10-CM

## 2020-11-25 DIAGNOSIS — G8929 Other chronic pain: Secondary | ICD-10-CM

## 2020-11-25 DIAGNOSIS — M25512 Pain in left shoulder: Secondary | ICD-10-CM

## 2020-11-25 LAB — LIPID PANEL
Cholesterol: 182 mg/dL (ref 0–200)
HDL: 74.4 mg/dL (ref >39.00)
LDL Cholesterol: 90 mg/dL (ref 0–99)
NonHDL: 108.04
Total CHOL/HDL Ratio: 2
Triglycerides: 88 mg/dL (ref 0.0–149.0)
VLDL: 17.6 mg/dL (ref 0.0–40.0)

## 2020-11-25 MED ORDER — DICLOFENAC SODIUM 75 MG PO TBEC: 75.0000 mg | DELAYED_RELEASE_TABLET | Freq: Every day | ORAL | 1 refills | Status: AC | PRN

## 2020-11-25 MED ORDER — LISINOPRIL-HYDROCHLOROTHIAZIDE 20-25 MG PO TABS: 1.0000 | ORAL_TABLET | Freq: Every day | ORAL | 3 refills | Status: AC

## 2020-11-25 MED ORDER — MONTELUKAST SODIUM 10 MG PO TABS: 10.0000 mg | ORAL_TABLET | Freq: Every day | ORAL | 3 refills | Status: AC

## 2020-11-25 NOTE — Assessment & Plan Note (Signed)
bp controlled. Cont lisinopril-hctz 20-25

## 2020-11-25 NOTE — Assessment & Plan Note (Signed)
Alternating singulair and zyrtec.

## 2020-11-25 NOTE — Assessment & Plan Note (Signed)
Check lipids today. Cont atorvastatin 40 mg

## 2020-11-25 NOTE — Assessment & Plan Note (Signed)
Advised trial of tumeric and to decrease diclofenac use

## 2020-11-25 NOTE — Progress Notes (Signed)
Subjective:     Christina Meadows is a 70 y.o. female presenting for Transitions Of Care and Referral (Mammogram at [facility] )     HPI  #HTN - bp controlled - no cp, sob, dizziness   #shoulder pain - x 5 months - throbbing at night - radiating down the arm the back of the shoulders - left side - will occasionally radiate to the right side - known arthritis and assumes that is what this - treatment - tylenol w/ limited improvement - takes diclofenac 75 mg bid -   Thinking of moving to [place] due to cost of living  Review of Systems   Social History   Tobacco Use  Smoking Status Former Smoker  . Types: Cigarettes  . Quit date: 09/07/1978  . Years since quitting: 42.2  Smokeless Tobacco Never Used        Objective:    BP Readings from Last 3 Encounters:  11/25/20 132/70  05/24/20 110/74  05/16/20 125/65   Wt Readings from Last 3 Encounters:  11/25/20 176 lb (79.8 kg)  05/24/20 172 lb (78 kg)  05/14/20 169 lb (76.7 kg)    BP 132/70   Pulse 63   Temp 97.7 F (36.5 C) (Temporal)   Ht 4\' 11"  (1.499 m)   Wt 176 lb (79.8 kg)   SpO2 98%   BMI 35.55 kg/m    Physical Exam Constitutional:      General: She is not in acute distress.    Appearance: She is well-developed. She is not diaphoretic.  HENT:     Right Ear: External ear normal.     Left Ear: External ear normal.     Nose: Nose normal.  Eyes:     Conjunctiva/sclera: Conjunctivae normal.  Neck:     Comments: ttp along the c-spine Negative spurling Cardiovascular:     Rate and Rhythm: Normal rate.  Pulmonary:     Effort: Pulmonary effort is normal.  Musculoskeletal:     Cervical back: Neck supple.     Comments: Left shoulder: Inspection: no swelling Palpation: no ttp ROM: normal w/o pain Strength: normal ?+hawkings  Skin:    General: Skin is warm and dry.     Capillary Refill: Capillary refill takes less than 2 seconds.  Neurological:     Mental Status: She is  alert. Mental status is at baseline.  Psychiatric:        Mood and Affect: Mood normal.        Behavior: Behavior normal.           Assessment & Plan:   Problem List Items Addressed This Visit      Cardiovascular and Mediastinum   Essential hypertension    bp controlled. Cont lisinopril-hctz 20-25      Relevant Medications   lisinopril-hydrochlorothiazide (ZESTORETIC) 20-25 MG tablet     Musculoskeletal and Integument   Bilateral primary osteoarthritis of knee    Advised trial of tumeric and to decrease diclofenac use      Relevant Medications   diclofenac (VOLTAREN) 75 MG EC tablet     Other   Anxiety    Doing well on citalopram 40 mg, continue.       Hyperlipidemia - Primary    Check lipids today. Cont atorvastatin 40 mg      Relevant Medications   lisinopril-hydrochlorothiazide (ZESTORETIC) 20-25 MG tablet   Other Relevant Orders   Lipid panel   Seasonal allergies    Alternating singulair and zyrtec.  Chronic left shoulder pain    Etiology unclear - impingement or arthritis. Advised PT but she would like to hold off on this. She will contact if she wants a referral      Relevant Medications   diclofenac (VOLTAREN) 75 MG EC tablet    Other Visit Diagnoses    Allergic rhinitis, unspecified seasonality, unspecified trigger       Relevant Medications   montelukast (SINGULAIR) 10 MG tablet   Need for hepatitis C screening test       Relevant Orders   Hepatitis C antibody       Return if symptoms worsen or fail to improve.  Lesleigh Noe, MD  This visit occurred during the SARS-CoV-2 public health emergency.  Safety protocols were in place, including screening questions prior to the visit, additional usage of staff PPE, and extensive cleaning of exam room while observing appropriate contact time as indicated for disinfecting solutions.

## 2020-11-25 NOTE — Assessment & Plan Note (Signed)
Etiology unclear - impingement or arthritis. Advised PT but she would like to hold off on this. She will contact if she wants a referral

## 2020-11-25 NOTE — Patient Instructions (Addendum)
Curcumin or Tumeric  Research has shown that Curcumin (Tumeric) supplements are just as good as high dose anti-inflammatory medications (like diclofenac and ibuprofen) at treating pain from osteoarthritis without the side effects.   Take Curcumin 500 mg Three times daily   -- You can find a bottle at [employer] for $8 which should last a month -- [place] is around $9 and is available at [facility]  For shoulder pain would recommend physical therapy

## 2020-11-25 NOTE — Assessment & Plan Note (Signed)
Doing well on citalopram 40 mg, continue.

## 2020-11-26 LAB — HEPATITIS C ANTIBODY
Hepatitis C Ab: NONREACTIVE
SIGNAL TO CUT-OFF: 0.01 (ref <1.00)

## 2021-08-02 IMAGING — MR MR HEAD WO/W CM
13 series · 46 of 48 positions shown · IV contrast (gadavist)
Comparison: None.

CLINICAL DATA: Slurred speech, left arm weakness, headache

EXAM:
MRI HEAD WITHOUT AND WITH CONTRAST
MRA HEAD WITHOUT CONTRAST
TECHNIQUE: Multiplanar, multiecho pulse sequences of the brain and surrounding
structures were obtained without and with intravenous contrast.
Angiographic images of the head were obtained using MRA technique
without contrast.
CONTRAST:  7mL GADAVIST GADOBUTROL 1 MMOL/ML IV SOLN

[Series 5: ax dwi_tracew · axial · 3.0mm · 0.60mm/px · z∈[-133,+21]mm · 4 of 48 slices shown]
[im 1/48]
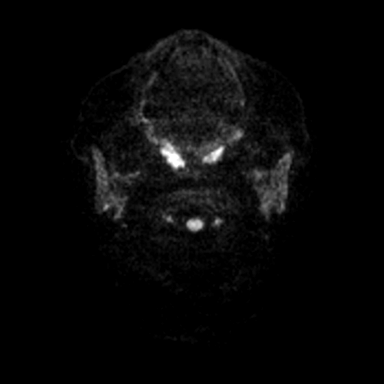
[im 16/48]
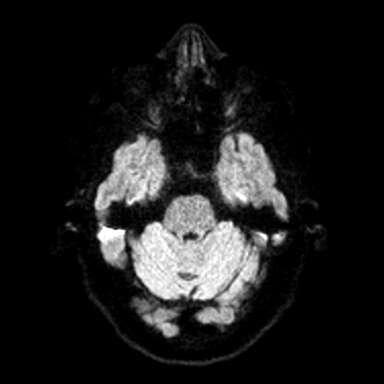
[im 32/48]
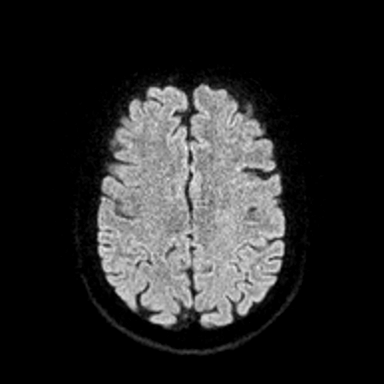
[im 48/48]
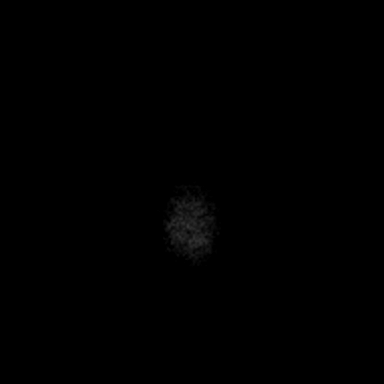

[Series 6: ax dwi_adc · axial · 3.0mm · 0.60mm/px · z∈[-133,+21]mm · 3 of 48 slices shown]
[im 1/48]
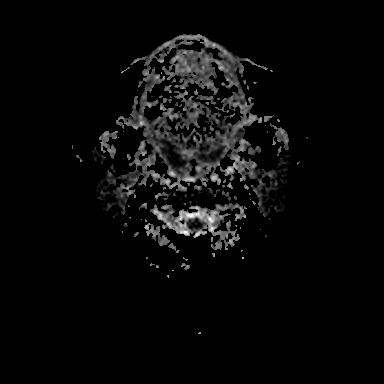
[im 24/48]
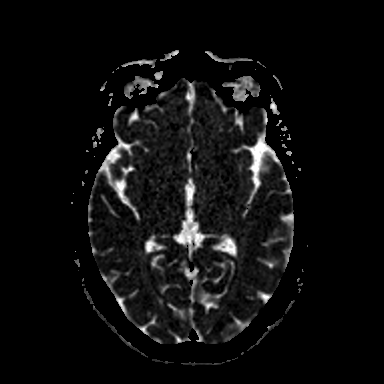
[im 48/48]
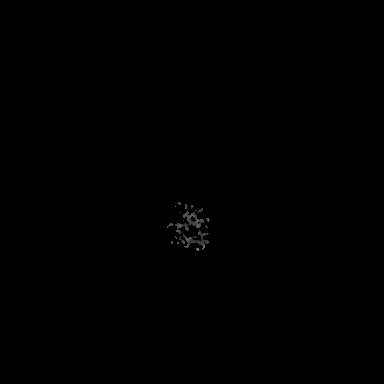

[Series 7: cor dwi_tracew · coronal · 5.0mm · 0.60mm/px · 2 of 40 slices shown]
[im 1/40]
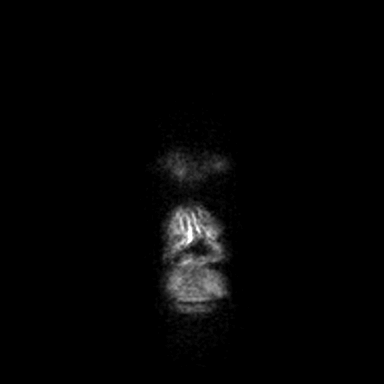
[im 40/40]
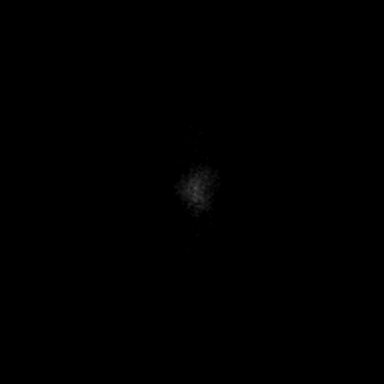

[Series 8: cor dwi_adc · coronal · 5.0mm · 0.60mm/px · 2 of 39 slices shown]
[im 1/39]
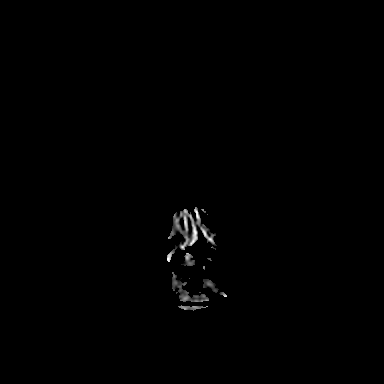
[im 39/39]
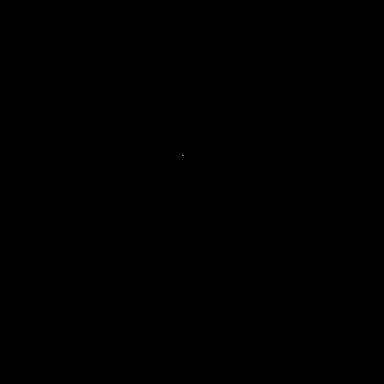

[Series 14: T1 · sagittal · 5.0mm · 0.62mm/px · 1 of 25 slices shown (1 of 2)]
[im 1/25]
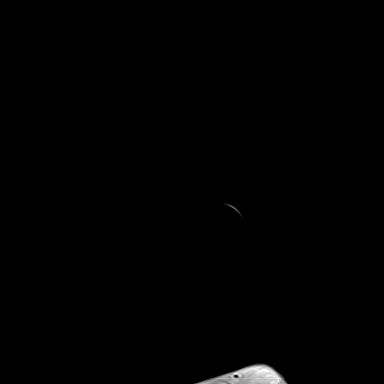

[Series 15: T2 · axial · 5.0mm · 0.53mm/px · z∈[-133,+22]mm · 2 of 27 slices shown]
[im 1/27]
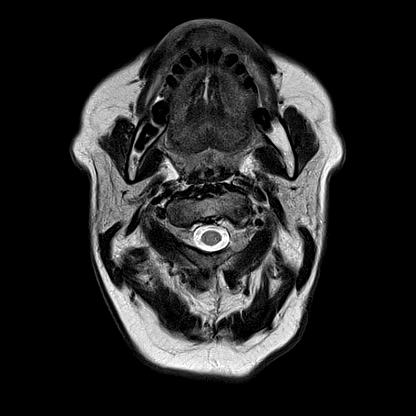
[im 27/27]
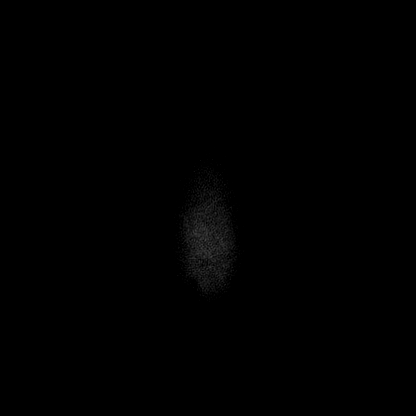

[Series 17: pha_images · axial · 3.0mm · 0.90mm/px · z∈[-144,+20]mm · 3 of 56 slices shown]
[im 1/56]
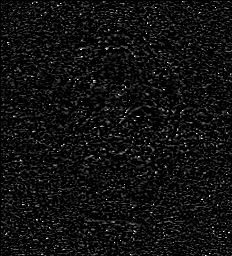
[im 28/56]
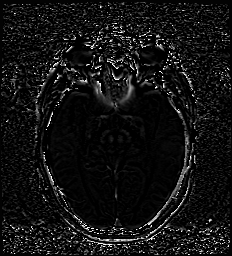
[im 56/56]
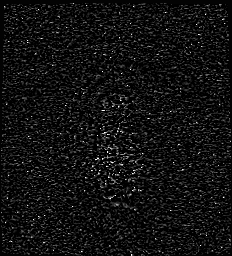

[Series 18: swi_images · axial · 3.0mm · 0.90mm/px · z∈[-144,+31]mm · 4 of 60 slices shown]
[im 1/60]
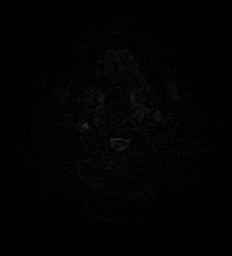
[im 20/60]
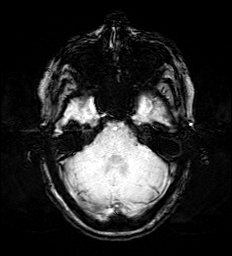
[im 40/60]
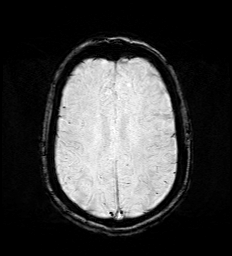
[im 60/60]
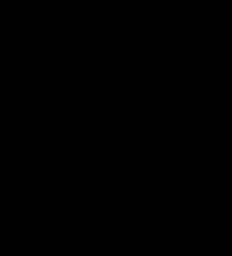

[Series 20: FLAIR · axial · 3.0mm · 0.53mm/px · z∈[-136,+25]mm · 3 of 55 slices shown]
[im 1/55]
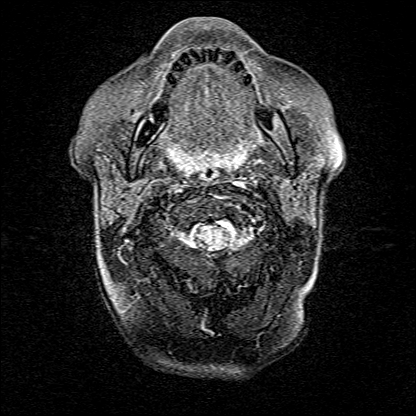
[im 28/55]
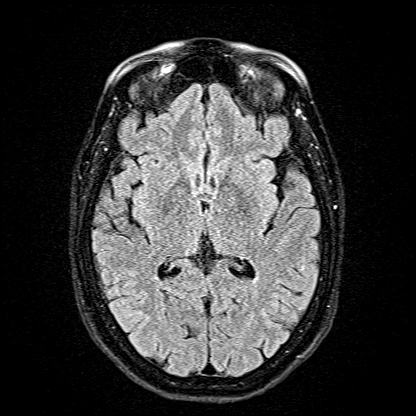
[im 55/55]
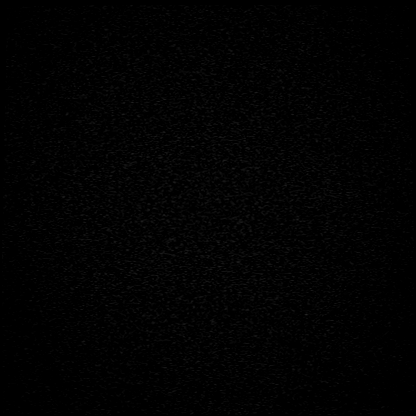

[Series 21: T1 · axial · 1.0mm · 0.98mm/px · z∈[-144,+29]mm · 8 of 176 slices shown (2 of 2)]
[im 1/176]
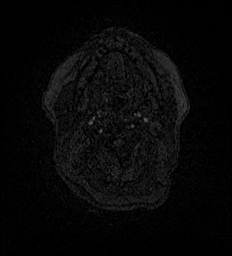
[im 20/176]
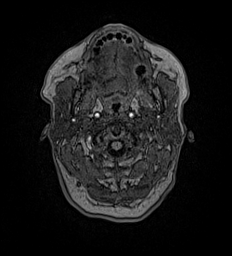
[im 59/176]
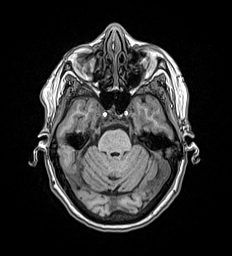
[im 78/176]
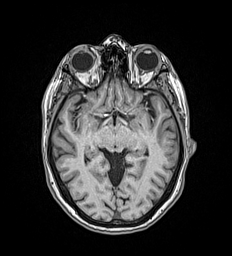
[im 98/176]
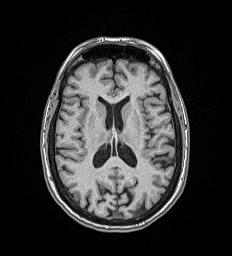
[im 117/176]
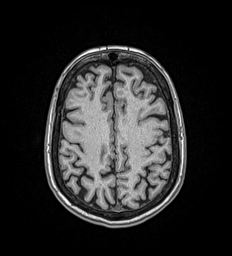
[im 156/176]
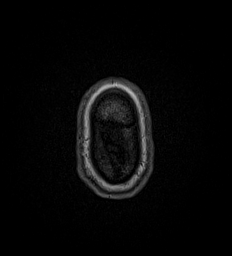
[im 176/176]
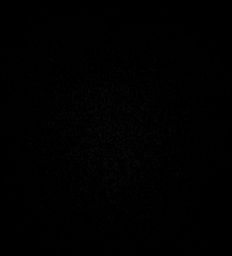

[Series 22: T2 post-contrast · coronal · 5.0mm · 0.57mm/px · 2 of 29 slices shown]
[im 1/29]
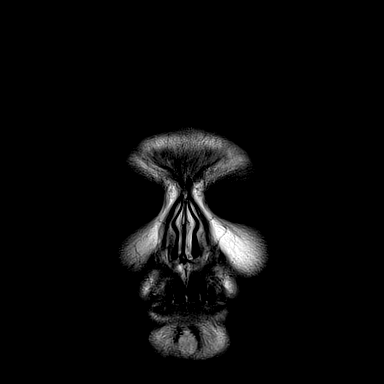
[im 29/29]
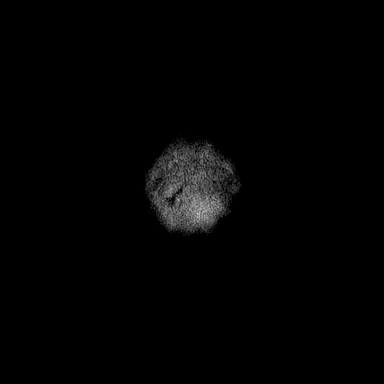

[Series 23: T1 post-contrast · axial · 1.0mm · 0.98mm/px · z∈[-144,+29]mm · 10 of 176 slices shown (1 of 2)]
[im 1/176]
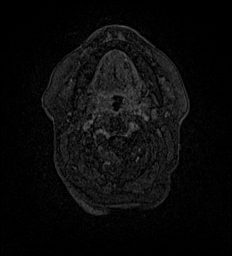
[im 20/176]
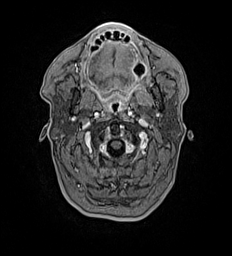
[im 39/176]
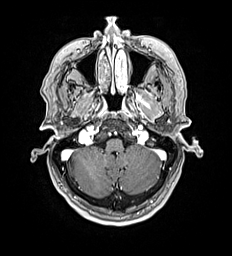
[im 59/176]
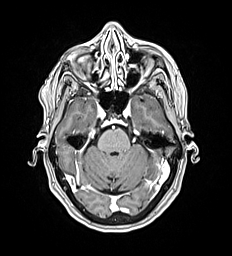
[im 78/176]
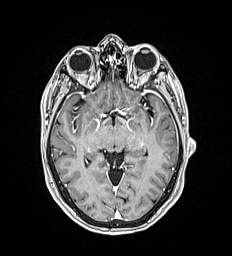
[im 98/176]
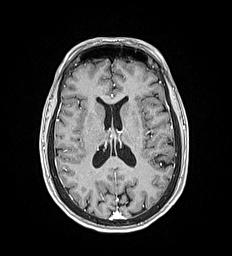
[im 117/176]
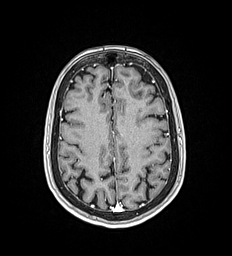
[im 137/176]
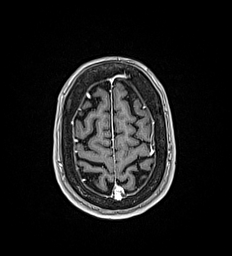
[im 156/176]
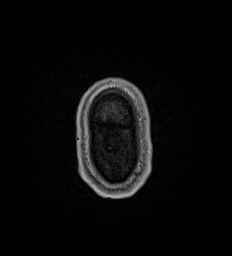
[im 176/176]
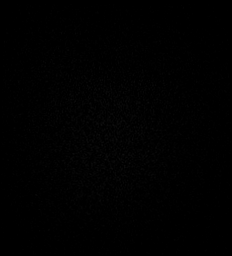

[Series 24: T1 post-contrast · coronal · 5.0mm · 0.57mm/px · 2 of 29 slices shown (2 of 2)]
[im 1/29]
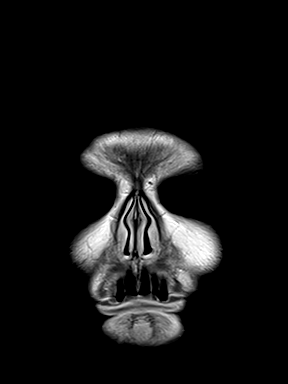
[im 29/29]
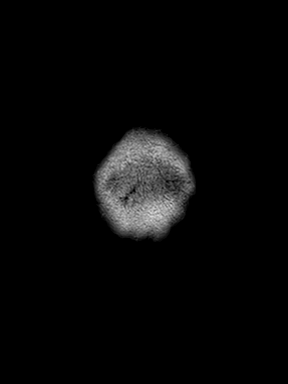

[46 of 48 positions shown; findings below may reference images not displayed]

FINDINGS: MRI HEAD

Brain: There is no acute infarction or intracranial hemorrhage.
There is no intracranial mass, mass effect, or edema. There is no
hydrocephalus or extra-axial fluid collection. Ventricles and sulci
are normal in size and configuration. Few scattered foci of T2
hyperintensity in the supratentorial white matter are nonspecific
but may reflect mild chronic microvascular ischemic changes. No
abnormal enhancement.

Vascular: Major vessel flow voids at the skull base are preserved.

Skull and upper cervical spine: Normal marrow signal is preserved.

Sinuses/Orbits: Minor mucosal thickening.  Orbits are unremarkable.

Other: Sella is unremarkable.  Mastoid air cells are clear.

MRA HEAD

Intracranial internal carotid arteries are patent. Middle and
anterior cerebral arteries are patent. Intracranial vertebral
arteries, basilar artery, posterior cerebral arteries are patent.
There is no significant stenosis or aneurysm.
IMPRESSION: No evidence of recent infarction, hemorrhage, or mass. Mild chronic
microvascular ischemic changes.

No proximal intracranial vessel occlusion or significant stenosis.

## 2022-12-03 ENCOUNTER — Telehealth: Payer: Self-pay | Admitting: Family Medicine

## 2022-12-03 NOTE — Telephone Encounter (Signed)
Contacted Christina Meadows to schedule their annual wellness visit. Patient declined to schedule AWV at this time. Transferred care, moved to [place] 3.16.23.  [place] Direct Dial: [phone]
